# Patient Record
Sex: Male | Born: 1975 | Race: Black or African American | Hispanic: No | Marital: Single | Smoking: Current every day smoker
Health system: Southern US, Community
[De-identification: ages and names within clinical notes are randomized; demographics above are authoritative.]

## PROBLEM LIST (undated history)

## (undated) DIAGNOSIS — J3489 Other specified disorders of nose and nasal sinuses: Secondary | ICD-10-CM

## (undated) DIAGNOSIS — F319 Bipolar disorder, unspecified: Secondary | ICD-10-CM

## (undated) DIAGNOSIS — Z87442 Personal history of urinary calculi: Secondary | ICD-10-CM

## (undated) HISTORY — PX: CIRCUMCISION: SUR203

---

## 2012-09-29 ENCOUNTER — Emergency Department (HOSPITAL_COMMUNITY)
Admission: EM | Admit: 2012-09-29 | Discharge: 2012-09-29 | Disposition: A | Discharge status: Home / Self Care | Payer: Medicare Other | Attending: Emergency Medicine | Admitting: Emergency Medicine

## 2012-09-29 ENCOUNTER — Encounter (HOSPITAL_COMMUNITY): Payer: Self-pay | Admitting: *Deleted

## 2012-09-29 ENCOUNTER — Emergency Department (HOSPITAL_COMMUNITY): Payer: Medicare Other

## 2012-09-29 DIAGNOSIS — Y92410 Unspecified street and highway as the place of occurrence of the external cause: Secondary | ICD-10-CM | POA: Insufficient documentation

## 2012-09-29 DIAGNOSIS — Y9389 Activity, other specified: Secondary | ICD-10-CM | POA: Insufficient documentation

## 2012-09-29 DIAGNOSIS — S46909A Unspecified injury of unspecified muscle, fascia and tendon at shoulder and upper arm level, unspecified arm, initial encounter: Secondary | ICD-10-CM | POA: Insufficient documentation

## 2012-09-29 DIAGNOSIS — F17200 Nicotine dependence, unspecified, uncomplicated: Secondary | ICD-10-CM | POA: Insufficient documentation

## 2012-09-29 DIAGNOSIS — S4980XA Other specified injuries of shoulder and upper arm, unspecified arm, initial encounter: Secondary | ICD-10-CM | POA: Insufficient documentation

## 2012-09-29 DIAGNOSIS — M25512 Pain in left shoulder: Secondary | ICD-10-CM

## 2012-09-29 DIAGNOSIS — Z8659 Personal history of other mental and behavioral disorders: Secondary | ICD-10-CM | POA: Insufficient documentation

## 2012-09-29 HISTORY — DX: Bipolar disorder, unspecified: F31.9

## 2012-09-29 MED ORDER — IBUPROFEN 400 MG PO TABS: 400.0000 mg | ORAL_TABLET | Freq: Four times a day (QID) | ORAL | Status: DC | PRN
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
## 2012-09-29 NOTE — ED Notes (Signed)
Called no answer

## 2012-09-29 NOTE — ED Notes (Signed)
No answer
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
## 2012-10-03 NOTE — ED Provider Notes (Signed)
Medical screening examination/treatment/procedure(s) were performed by non-physician practitioner and as supervising physician I was immediately available for consultation/collaboration.  Kirstina Leinweber, MD 10/03/12 1145 

## 2013-11-21 ENCOUNTER — Emergency Department (HOSPITAL_COMMUNITY): Payer: Medicare Other

## 2013-11-21 ENCOUNTER — Emergency Department (HOSPITAL_COMMUNITY)
Admission: EM | Admit: 2013-11-21 | Discharge: 2013-11-21 | Disposition: A | Discharge status: Home / Self Care | Payer: Medicare Other | Attending: Emergency Medicine | Admitting: Emergency Medicine

## 2013-11-21 ENCOUNTER — Encounter (HOSPITAL_COMMUNITY): Payer: Self-pay | Admitting: Emergency Medicine

## 2013-11-21 DIAGNOSIS — N200 Calculus of kidney: Secondary | ICD-10-CM | POA: Diagnosis not present

## 2013-11-21 DIAGNOSIS — R109 Unspecified abdominal pain: Secondary | ICD-10-CM | POA: Insufficient documentation

## 2013-11-21 DIAGNOSIS — Z8659 Personal history of other mental and behavioral disorders: Secondary | ICD-10-CM | POA: Insufficient documentation

## 2013-11-21 DIAGNOSIS — Z720 Tobacco use: Secondary | ICD-10-CM | POA: Insufficient documentation

## 2013-11-21 LAB — URINALYSIS, ROUTINE W REFLEX MICROSCOPIC
BILIRUBIN URINE: NEGATIVE
Glucose, UA: NEGATIVE mg/dL
KETONES UR: NEGATIVE mg/dL
NITRITE: NEGATIVE
PH: 5.5 (ref 5.0–8.0)
Protein, ur: 30 mg/dL — AB
Specific Gravity, Urine: 1.024 (ref 1.005–1.030)
Urobilinogen, UA: 1 mg/dL (ref 0.0–1.0)

## 2013-11-21 LAB — POTASSIUM: POTASSIUM: 4.6 meq/L (ref 3.7–5.3)

## 2013-11-21 LAB — URINE MICROSCOPIC-ADD ON

## 2013-11-21 LAB — COMPREHENSIVE METABOLIC PANEL
ALK PHOS: 54 U/L (ref 39–117)
ALT: 23 U/L (ref 0–53)
AST: 35 U/L (ref 0–37)
Albumin: 4.1 g/dL (ref 3.5–5.2)
Anion gap: 11 (ref 5–15)
BILIRUBIN TOTAL: 0.7 mg/dL (ref 0.3–1.2)
BUN: 13 mg/dL (ref 6–23)
CHLORIDE: 102 meq/L (ref 96–112)
CO2: 25 meq/L (ref 19–32)
CREATININE: 0.98 mg/dL (ref 0.50–1.35)
Calcium: 9.3 mg/dL (ref 8.4–10.5)
GFR calc Af Amer: >90 mL/min (ref >90)
Glucose, Bld: 96 mg/dL (ref 70–99)
POTASSIUM: 6.6 meq/L — AB (ref 3.7–5.3)
Sodium: 138 mEq/L (ref 137–147)
Total Protein: 8.4 g/dL — ABNORMAL HIGH (ref 6.0–8.3)

## 2013-11-21 LAB — CBC WITH DIFFERENTIAL/PLATELET
Basophils Absolute: 0 10*3/uL (ref 0.0–0.1)
Basophils Relative: 0 % (ref 0–1)
Eosinophils Absolute: 0.1 10*3/uL (ref 0.0–0.7)
Eosinophils Relative: 1 % (ref 0–5)
HEMATOCRIT: 45 % (ref 39.0–52.0)
HEMOGLOBIN: 15.5 g/dL (ref 13.0–17.0)
LYMPHS PCT: 34 % (ref 12–46)
Lymphs Abs: 2.5 10*3/uL (ref 0.7–4.0)
MCH: 30.3 pg (ref 26.0–34.0)
MCHC: 34.4 g/dL (ref 30.0–36.0)
MCV: 88.1 fL (ref 78.0–100.0)
MONO ABS: 0.6 10*3/uL (ref 0.1–1.0)
MONOS PCT: 8 % (ref 3–12)
NEUTROS ABS: 4.2 10*3/uL (ref 1.7–7.7)
NEUTROS PCT: 57 % (ref 43–77)
Platelets: 221 10*3/uL (ref 150–400)
RBC: 5.11 MIL/uL (ref 4.22–5.81)
RDW: 12.8 % (ref 11.5–15.5)
WBC: 7.5 10*3/uL (ref 4.0–10.5)

## 2013-11-21 LAB — LIPASE, BLOOD: LIPASE: 29 U/L (ref 11–59)

## 2013-11-21 MED ORDER — TRAMADOL HCL 50 MG PO TABS
50.0000 mg | ORAL_TABLET | Freq: Once | ORAL | Status: AC
  Administered 2013-11-21: 50 mg via ORAL
  Filled 2013-11-21: qty 1

## 2013-11-21 MED ORDER — TRAMADOL HCL 50 MG PO TABS: 50.0000 mg | ORAL_TABLET | Freq: Four times a day (QID) | ORAL | Status: DC | PRN

## 2013-11-21 NOTE — ED Provider Notes (Signed)
CSN: [id number]     Arrival date & time 11/21/13  1010 History   First MD Initiated Contact with Patient 11/21/13 1024     Chief Complaint  Patient presents with  . Flank Pain  . Abdominal Pain     (Consider location/radiation/quality/duration/timing/severity/associated sxs/prior Treatment) HPI Randall [name] is a 38 y.o. male who presents to emergency department complaining of right upper abdominal pain, right flank pain. Patient states his pain has been intermittent for 2 months. He states it comes and goes, when it comes its about 8 out of 10, sharp, radiating to the right groin. He denies any associated nausea or vomiting. No changes in bowels. No urinary symptoms, no blood in his urine. He denies any fever or chills. He states pain is worse at night and when he lifts heavy furniture. Patient states he works for a [employer]. States his job requires him to lift, push, pull, heavy objects all day. He states he is taking Aleve for pain which helps. He states today pain worsened so he came to emergency department to begin her out what's going on. No history of the same.  Past Medical History  Diagnosis Date  . Bipolar 1 disorder    No past surgical history on file. No family history on file. History  Substance Use Topics  . Smoking status: Current Every Day Smoker  . Smokeless tobacco: Not on file  . Alcohol Use: Yes     Comment: occ    Review of Systems  Constitutional: Negative for fever and chills.  Respiratory: Negative for cough, chest tightness and shortness of breath.   Cardiovascular: Negative for chest pain, palpitations and leg swelling.  Gastrointestinal: Positive for abdominal pain. Negative for nausea, vomiting, diarrhea and abdominal distention.  Genitourinary: Positive for flank pain. Negative for dysuria, urgency, frequency and hematuria.  Musculoskeletal: Negative for arthralgias, myalgias, neck pain and neck stiffness.  Skin: Negative for rash.   Allergic/Immunologic: Negative for immunocompromised state.  Neurological: Negative for dizziness, weakness, light-headedness, numbness and headaches.  All other systems reviewed and are negative.     Allergies  Review of patient's allergies indicates no known allergies.  Home Medications   Prior to Admission medications   Medication Sig Start Date End Date Taking? Authorizing Provider  ibuprofen (ADVIL,MOTRIN) 400 MG tablet Take 1 tablet (400 mg total) by mouth every 6 (six) hours as needed for pain. 09/29/12   Junius FinnerErin O'Malley, Randall-C   BP 145/81  Pulse 86  Temp(Src) 98 F (36.7 C) (Oral)  Resp 19  Ht 6' 0.5" (1.842 m)  Wt 200 lb (90.719 kg)  BMI 26.74 kg/m2  SpO2 99% Physical Exam  Nursing note and vitals reviewed. Constitutional: He is oriented to person, place, and time. He appears well-developed and well-nourished. No distress.  HENT:  Head: Normocephalic and atraumatic.  Eyes: Conjunctivae are normal.  Neck: Neck supple.  Cardiovascular: Normal rate, regular rhythm and normal heart sounds.   Pulmonary/Chest: Effort normal. No respiratory distress. He has no wheezes. He has no rales.  Abdominal: Soft. Bowel sounds are normal. He exhibits no distension. There is no tenderness. There is no rebound.  No CVA tenderness bilaterally  Musculoskeletal: He exhibits no edema.  Neurological: He is alert and oriented to person, place, and time.  Skin: Skin is warm and dry.    ED Course  Procedures (including critical care time) Labs Review Labs Reviewed  COMPREHENSIVE METABOLIC PANEL - Abnormal; Notable for the following:    Potassium 6.6 (*)  Total Protein 8.4 (*)    All other components within normal limits  URINALYSIS, ROUTINE W REFLEX MICROSCOPIC - Abnormal; Notable for the following:    Hgb urine dipstick TRACE (*)    Protein, ur 30 (*)    Leukocytes, UA SMALL (*)    All other components within normal limits  CBC WITH DIFFERENTIAL  LIPASE, BLOOD  URINE  MICROSCOPIC-ADD ON  POTASSIUM    Imaging Review No results found.   EKG Interpretation None      MDM   Final diagnoses:  Flank pain, acute    Patient with intermittent right flank and right abdominal pain for 2 months. Pain comes and goes, today was worse. Currently subsided. question Cholelithiasis versus kidney stone versus musculoskeletal pain. Will get blood work, urinalysis. Patient does not want any medications at this time.  Labs normal except for potassium elevated 6.6, hemolyzed, will recheck.  CT pending. Signed out to Randall [name]       Randall [name] A Randall Peltzer, Randall-C 11/21/13 1205

## 2013-11-21 NOTE — ED Provider Notes (Signed)
12:11 PM Patient signed out to me by Jaynie Crumbleatyana Kirichenko, PA-C. Patient disposition pending CT abdomen pelvis to evaluate for possible kidney stone. Patient also noted to have hyperkalemia which will be rechecked due to hemolysis.   1:04 PM Patient's CT abdomen pelvis shows an 18mm renal stone on the right. I will consult Urology for further evaluation.   Results for orders placed during the hospital encounter of 11/21/13  CBC WITH DIFFERENTIAL      Result Value Ref Range   WBC 7.5  4.0 - 10.5 K/uL   RBC 5.11  4.22 - 5.81 MIL/uL   Hemoglobin 15.5  13.0 - 17.0 g/dL   HCT 16.145.0  09.639.0 - 04.552.0 %   MCV 88.1  78.0 - 100.0 fL   MCH 30.3  26.0 - 34.0 pg   MCHC 34.4  30.0 - 36.0 g/dL   RDW 40.912.8  81.111.5 - 91.415.5 %   Platelets 221  150 - 400 K/uL   Neutrophils Relative % 57  43 - 77 %   Neutro Abs 4.2  1.7 - 7.7 K/uL   Lymphocytes Relative 34  12 - 46 %   Lymphs Abs 2.5  0.7 - 4.0 K/uL   Monocytes Relative 8  3 - 12 %   Monocytes Absolute 0.6  0.1 - 1.0 K/uL   Eosinophils Relative 1  0 - 5 %   Eosinophils Absolute 0.1  0.0 - 0.7 K/uL   Basophils Relative 0  0 - 1 %   Basophils Absolute 0.0  0.0 - 0.1 K/uL  COMPREHENSIVE METABOLIC PANEL      Result Value Ref Range   Sodium 138  137 - 147 mEq/L   Potassium 6.6 (*) 3.7 - 5.3 mEq/L   Chloride 102  96 - 112 mEq/L   CO2 25  19 - 32 mEq/L   Glucose, Bld 96  70 - 99 mg/dL   BUN 13  6 - 23 mg/dL   Creatinine, Ser 7.820.98  0.50 - 1.35 mg/dL   Calcium 9.3  8.4 - 95.610.5 mg/dL   Total Protein 8.4 (*) 6.0 - 8.3 g/dL   Albumin 4.1  3.5 - 5.2 g/dL   AST 35  0 - 37 U/L   ALT 23  0 - 53 U/L   Alkaline Phosphatase 54  39 - 117 U/L   Total Bilirubin 0.7  0.3 - 1.2 mg/dL   GFR calc non Af Amer >90  >90 mL/min   GFR calc Af Amer >90  >90 mL/min   Anion gap 11  5 - 15  LIPASE, BLOOD      Result Value Ref Range   Lipase 29  11 - 59 U/L  URINALYSIS, ROUTINE W REFLEX MICROSCOPIC      Result Value Ref Range   Color, Urine YELLOW  YELLOW   APPearance CLEAR  CLEAR    Specific Gravity, Urine 1.024  1.005 - 1.030   pH 5.5  5.0 - 8.0   Glucose, UA NEGATIVE  NEGATIVE mg/dL   Hgb urine dipstick TRACE (*) NEGATIVE   Bilirubin Urine NEGATIVE  NEGATIVE   Ketones, ur NEGATIVE  NEGATIVE mg/dL   Protein, ur 30 (*) NEGATIVE mg/dL   Urobilinogen, UA 1.0  0.0 - 1.0 mg/dL   Nitrite NEGATIVE  NEGATIVE   Leukocytes, UA SMALL (*) NEGATIVE  URINE MICROSCOPIC-ADD ON      Result Value Ref Range   WBC, UA 7-10  <3 WBC/hpf   RBC / HPF 0-2  <3 RBC/hpf  Bacteria, UA RARE  RARE  POTASSIUM      Result Value Ref Range   Potassium 4.6  3.7 - 5.3 mEq/L   Ct Abdomen Pelvis Wo Contrast  11/21/2013   CLINICAL DATA:  Initial encounter for acute left flank pain.  EXAM: CT ABDOMEN AND PELVIS WITHOUT CONTRAST  TECHNIQUE: Multidetector CT imaging of the abdomen and pelvis was performed following the standard protocol without IV contrast.  COMPARISON:  None.  FINDINGS: Lower chest: Chronic interstitial change seen in the lung bases likely related to smoking.  Hepatobiliary: No focal abnormality in the liver on this study without intravenous contrast. No evidence for hepatomegaly. There is no evidence for gallstones, gallbladder wall thickening, or pericholecystic fluid. No intrahepatic or extrahepatic biliary dilation.  Pancreas: No focal mass lesion. No dilatation of the main duct. No intraparenchymal cyst. No peripancreatic edema.  Spleen: No splenomegaly. No focal mass lesion.  Adrenals/Urinary Tract: No adrenal nodule or mass. 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 No evidence for ureteral or bladder stones.  Stomach/Bowel: Stomach is nondistended. No gastric wall thickening. No evidence of outlet obstruction. Duodenum diverticulum evident. No small bowel wall thickening. No small bowel dilatation. Terminal ileum is normal. Appendix is normal. No gross colonic mass. No  colonic wall thickening. No substantial diverticular change.  Vascular/Lymphatic: No abdominal aortic aneurysm. There is gastrohepatic or hepatoduodenal ligament lymphadenopathy. No retroperitoneal lymphadenopathy. Pelvic sidewall lymphadenopathy.  Reproductive: No intraperitoneal free fluid.  Other: Bone windows reveal no worrisome lytic or sclerotic osseous lesions.  Musculoskeletal: Bone windows reveal no worrisome lytic or sclerotic osseous lesions.  IMPRESSION: 18 x 12 x 15 mm nonobstructing stone in the right renal pelvis. No secondary changes in the right kidney.  Otherwise unremarkable exam of the abdomen and pelvis.   Electronically Signed   By: Kennith CenterEric  Mansell M.D.   On: 11/21/2013 12:49    2:20 PM I spoke with Dr. Vernie Ammonsttelin who recommends office follow up. Patient will be discharged with Tramadol for pain. Patient instructed to call the office after being discharged to schedule a follow up visit.   Emilia BeckKaitlyn Ryshawn Sanzone, PA-C 11/21/13 1421

## 2013-11-21 NOTE — Discharge Instructions (Signed)
Follow up with Dr. Vernie Ammonsttelin for further evaluation and removal of your kidney stone. Refer to attached documents for more information. Take Tramadol as needed for pain.

## 2013-11-21 NOTE — ED Notes (Signed)
To ED via private vehicle with c/o right sided flank/rib pain intermittently for 2 months--comes on for several hours, then leaves. Denies any hematuria, dysuria. States hurts mostly at night when laying on it,

## 2013-11-28 ENCOUNTER — Other Ambulatory Visit: Payer: Self-pay | Admitting: Urology

## 2013-12-10 NOTE — Patient Instructions (Signed)
Randall [name]  12/10/2013   Your procedure is scheduled on:  12/23/2013    [facility] to [facility].  Follow the Signs to Short Stay Center at   0530     am  Call this number if you have problems the morning of surgery: [phone]   Remember:   Do not eat food or drink liquids after midnight.   Take these medicines the morning of surgery with A SIP OF WATER:    Do not wear jewelry, .  Do not wear lotions, powders, or perfumes.  deodorant.  . Men may shave face and neck.  Do not bring valuables to the hospital.  Contacts, dentures or bridgework may not be worn into surgery.  Leave suitcase in the car. After surgery it may be brought to your room.  For patients admitted to the hospital, checkout time is 11:00 AM the day of  discharge.        Please read over the following fact sheets that you were given: , coughing and deep breathing exercises, leg exercises            [place] - Preparing for Surgery Before surgery, you can play an important role.  Because skin is not sterile, your skin needs to be as free of germs as possible.  You can reduce the number of germs on your skin by washing with CHG (chlorahexidine gluconate) soap before surgery.  CHG is an antiseptic cleaner which kills germs and bonds with the skin to continue killing germs even after washing. Please DO NOT use if you have an allergy to CHG or antibacterial soaps.  If your skin becomes reddened/irritated stop using the CHG and inform your nurse when you arrive at Short Stay. Do not shave (including legs and underarms) for at least 48 hours prior to the first CHG shower.  You may shave your face/neck. Please follow these instructions carefully:  1.  Shower with CHG Soap the night before surgery and the  morning of Surgery.  2.  If you choose to wash your hair, wash your hair first as usual with your  normal  shampoo.  3.  After you shampoo, rinse your hair and body thoroughly to remove the  shampoo.                            4.  Use CHG as you would any other liquid soap.  You can apply chg directly  to the skin and wash                       Gently with a scrungie or clean washcloth.  5.  Apply the CHG Soap to your body ONLY FROM THE NECK DOWN.   Do not use on face/ open                           Wound or open sores. Avoid contact with eyes, ears mouth and genitals (private parts).                       Wash face,  Genitals (private parts) with your normal soap.             6.  Wash thoroughly, paying special attention to the area where your surgery  will be performed.  7.  Thoroughly rinse your body with warm water from the  neck down.  8.  DO NOT shower/wash with your normal soap after using and rinsing off  the CHG Soap.                9.  Pat yourself dry with a clean towel.            10.  Wear clean pajamas.            11.  Place clean sheets on your bed the night of your first shower and do not  sleep with pets. Day of Surgery : Do not apply any lotions/deodorants the morning of surgery.  Please wear clean clothes to the hospital/surgery center.  FAILURE TO FOLLOW THESE INSTRUCTIONS MAY RESULT IN THE CANCELLATION OF YOUR SURGERY PATIENT SIGNATURE_________________________________  NURSE SIGNATURE__________________________________  ________________________________________________________________________  WHAT IS A BLOOD TRANSFUSION? Blood Transfusion Information  A transfusion is the replacement of blood or some of its parts. Blood is made up of multiple cells which provide different functions.  Red blood cells carry oxygen and are used for blood loss replacement.  White blood cells fight against infection.  Platelets control bleeding.  Plasma helps clot blood.  Other blood products are available for specialized needs, such as hemophilia or other clotting disorders. BEFORE THE TRANSFUSION  Who gives blood for transfusions?   Healthy volunteers who are fully evaluated to make sure  their blood is safe. This is blood bank blood. Transfusion therapy is the safest it has ever been in the practice of medicine. Before blood is taken from a donor, a complete history is taken to make sure that person has no history of diseases nor engages in risky social behavior (examples are intravenous drug use or sexual activity with multiple partners). The donor's travel history is screened to minimize risk of transmitting infections, such as malaria. The donated blood is tested for signs of infectious diseases, such as HIV and hepatitis. The blood is then tested to be sure it is compatible with you in order to minimize the chance of a transfusion reaction. If you or a relative donates blood, this is often done in anticipation of surgery and is not appropriate for emergency situations. It takes many days to process the donated blood. RISKS AND COMPLICATIONS Although transfusion therapy is very safe and saves many lives, the main dangers of transfusion include:   Getting an infectious disease.  Developing a transfusion reaction. This is an allergic reaction to something in the blood you were given. Every precaution is taken to prevent this. The decision to have a blood transfusion has been considered carefully by your caregiver before blood is given. Blood is not given unless the benefits outweigh the risks. AFTER THE TRANSFUSION  Right after receiving a blood transfusion, you will usually feel much better and more energetic. This is especially true if your red blood cells have gotten low (anemic). The transfusion raises the level of the red blood cells which carry oxygen, and this usually causes an energy increase.  The nurse administering the transfusion will monitor you carefully for complications. HOME CARE INSTRUCTIONS  No special instructions are needed after a transfusion. You may find your energy is better. Speak with your caregiver about any limitations on activity for underlying diseases  you may have. SEEK MEDICAL CARE IF:   Your condition is not improving after your transfusion.  You develop redness or irritation at the intravenous (IV) site. SEEK IMMEDIATE MEDICAL CARE IF:  Any of the following symptoms occur over the next 12 hours:  Shaking chills.  You have a temperature by mouth above 102 F (38.9 C), not controlled by medicine.  Chest, back, or muscle pain.  People around you feel you are not acting correctly or are confused.  Shortness of breath or difficulty breathing.  Dizziness and fainting.  You get a rash or develop hives.  You have a decrease in urine output.  Your urine turns a dark color or changes to pink, red, or brown. Any of the following symptoms occur over the next 10 days:  You have a temperature by mouth above 102 F (38.9 C), not controlled by medicine.  Shortness of breath.  Weakness after normal activity.  The white part of the eye turns yellow (jaundice).  You have a decrease in the amount of urine or are urinating less often.  Your urine turns a dark color or changes to pink, red, or brown. Document Released: 01/07/2000 Document Revised: 04/03/2011 Document Reviewed: 08/26/2007 [facility] Patient Information 2014 [place], [place].  _______________________________________________________________________

## 2013-12-12 ENCOUNTER — Inpatient Hospital Stay (HOSPITAL_COMMUNITY)
Admission: RE | Admit: 2013-12-12 | Discharge: 2013-12-12 | Disposition: A | Discharge status: Home / Self Care | Payer: Medicare Other | Source: Ambulatory Visit

## 2013-12-17 ENCOUNTER — Inpatient Hospital Stay (HOSPITAL_COMMUNITY): Admission: RE | Admit: 2013-12-17 | Payer: Medicare Other | Source: Ambulatory Visit

## 2013-12-22 ENCOUNTER — Encounter (HOSPITAL_COMMUNITY)
Admission: RE | Admit: 2013-12-22 | Discharge: 2013-12-22 | Disposition: A | Discharge status: Home / Self Care | Payer: Medicare Other | Source: Ambulatory Visit | Attending: Urology | Admitting: Urology

## 2013-12-22 ENCOUNTER — Encounter (HOSPITAL_COMMUNITY): Payer: Self-pay

## 2013-12-22 ENCOUNTER — Encounter (HOSPITAL_COMMUNITY): Payer: Self-pay | Admitting: Anesthesiology

## 2013-12-22 DIAGNOSIS — Z801 Family history of malignant neoplasm of trachea, bronchus and lung: Secondary | ICD-10-CM | POA: Diagnosis not present

## 2013-12-22 DIAGNOSIS — F1099 Alcohol use, unspecified with unspecified alcohol-induced disorder: Secondary | ICD-10-CM | POA: Diagnosis not present

## 2013-12-22 DIAGNOSIS — Z8249 Family history of ischemic heart disease and other diseases of the circulatory system: Secondary | ICD-10-CM | POA: Diagnosis not present

## 2013-12-22 DIAGNOSIS — F17200 Nicotine dependence, unspecified, uncomplicated: Secondary | ICD-10-CM | POA: Diagnosis not present

## 2013-12-22 DIAGNOSIS — N200 Calculus of kidney: Secondary | ICD-10-CM | POA: Diagnosis not present

## 2013-12-22 HISTORY — DX: Other specified disorders of nose and nasal sinuses: J34.89

## 2013-12-22 HISTORY — DX: Personal history of urinary calculi: Z87.442

## 2013-12-22 LAB — CBC
HCT: 44.6 % (ref 39.0–52.0)
HEMOGLOBIN: 15 g/dL (ref 13.0–17.0)
MCH: 29.8 pg (ref 26.0–34.0)
MCHC: 33.6 g/dL (ref 30.0–36.0)
MCV: 88.7 fL (ref 78.0–100.0)
PLATELETS: 240 10*3/uL (ref 150–400)
RBC: 5.03 MIL/uL (ref 4.22–5.81)
RDW: 12.8 % (ref 11.5–15.5)
WBC: 8.6 10*3/uL (ref 4.0–10.5)

## 2013-12-22 LAB — BASIC METABOLIC PANEL
ANION GAP: 11 (ref 5–15)
BUN: 10 mg/dL (ref 6–23)
CHLORIDE: 98 meq/L (ref 96–112)
CO2: 27 meq/L (ref 19–32)
Calcium: 9.7 mg/dL (ref 8.4–10.5)
Creatinine, Ser: 1 mg/dL (ref 0.50–1.35)
GFR calc Af Amer: >90 mL/min (ref >90)
GFR calc non Af Amer: >90 mL/min (ref >90)
GLUCOSE: 95 mg/dL (ref 70–99)
POTASSIUM: 4.1 meq/L (ref 3.7–5.3)
SODIUM: 136 meq/L — AB (ref 137–147)

## 2013-12-22 LAB — ABO/RH: ABO/RH(D): O POS

## 2013-12-22 NOTE — Anesthesia Preprocedure Evaluation (Addendum)
Anesthesia Evaluation  Patient identified by MRN, date of birth, ID band Patient awake    Reviewed: Allergy & Precautions, H&P , NPO status , Patient's Chart, lab work & pertinent test results  Airway Mallampati: II  TM Distance: >3 FB Neck ROM: Full    Dental  (+) Poor Dentition, Dental Advisory Given Very poor dentition with many broken and missing teeth.:   Pulmonary Current Smoker,  breath sounds clear to auscultation  Pulmonary exam normal       Cardiovascular negative cardio ROS  Rhythm:Regular Rate:Normal     Neuro/Psych PSYCHIATRIC DISORDERS Bipolar Disorder negative neurological ROS     GI/Hepatic negative GI ROS, Neg liver ROS,   Endo/Other  negative endocrine ROS  Renal/GU negative Renal ROS  negative genitourinary   Musculoskeletal negative musculoskeletal ROS (+)   Abdominal   Peds negative pediatric ROS (+)  Hematology negative hematology ROS (+)   Anesthesia Other Findings   Reproductive/Obstetrics negative OB ROS                            Anesthesia Physical Anesthesia Plan  ASA: II  Anesthesia Plan: General   Post-op Pain Management:    Induction: Intravenous  Airway Management Planned: Oral ETT  Additional Equipment:   Intra-op Plan:   Post-operative Plan: Extubation in OR  Informed Consent: I have reviewed the patients History and Physical, chart, labs and discussed the procedure including the risks, benefits and alternatives for the proposed anesthesia with the patient or authorized representative who has indicated his/her understanding and acceptance.   Dental advisory given  Plan Discussed with: CRNA  Anesthesia Plan Comments:         Anesthesia Quick Evaluation

## 2013-12-22 NOTE — Patient Instructions (Addendum)
[address] Wonda HornerBruce Griebel  12/22/2013   Your procedure is scheduled on: Tuesday 12/23/13  [facility] at 05:30 AM.  Call this number if you have problems the morning of surgery [phone]: [phone]   Remember:   Do not eat food or drink liquids After Midnight.   Do not wear jewelry, make-up or nail polish.  Do not wear lotions, powders, or perfumes.   Do not shave 48 hours prior to surgery. Men may shave face and neck.  Do not bring valuables to the hospital.  Contacts, dentures or bridgework may not be worn into surgery.  Leave suitcase in the car. After surgery it may be brought to your room.  For patients admitted to the hospital, checkout time is 11:00 AM the day of discharge.    [place] - Preparing for Surgery Before surgery, you can play an important role.  Because skin is not sterile, your skin needs to be as free of germs as possible.  You can reduce the number of germs on your skin by washing with CHG (chlorahexidine gluconate) soap before surgery.  CHG is an antiseptic cleaner which kills germs and bonds with the skin to continue killing germs even after washing. Please DO NOT use if you have an allergy to CHG or antibacterial soaps.  If your skin becomes reddened/irritated stop using the CHG and inform your nurse when you arrive at Short Stay. Do not shave (including legs and underarms) for at least 48 hours prior to the first CHG shower.  You may shave your face/neck. Please follow these instructions carefully:  1.  Shower with CHG Soap the night before surgery and the  morning of Surgery.  2.  If you choose to wash your hair, wash your hair first as usual with your  normal  shampoo.  3.  After you shampoo, rinse your hair and body thoroughly to remove the  shampoo.                            4.  Use CHG as you would any other liquid soap.  You can apply chg directly  to the skin and wash                       Gently with a scrungie or clean washcloth.  5.   Apply the CHG Soap to your body ONLY FROM THE NECK DOWN.   Do not use on face/ open                           Wound or open sores. Avoid contact with eyes, ears mouth and genitals (private parts).                       Wash face,  Genitals (private parts) with your normal soap.             6.  Wash thoroughly, paying special attention to the area where your surgery  will be performed.  7.  Thoroughly rinse your body with warm water from the neck down.  8.  DO NOT shower/wash with your normal soap after using and rinsing off  the CHG Soap.                9.  Pat yourself dry with a clean towel.  10.  Wear clean pajamas.            11.  Place clean sheets on your bed the night of your first shower and do not  sleep with pets. Day of Surgery : Do not apply any lotions/deodorants the morning of surgery.  Please wear clean clothes to the hospital/surgery center.  FAILURE TO FOLLOW THESE INSTRUCTIONS MAY RESULT IN THE CANCELLATION OF YOUR SURGERY PATIENT SIGNATURE_________________________________  NURSE SIGNATURE__________________________________  ________________________________________________________________________  WHAT IS A BLOOD TRANSFUSION? Blood Transfusion Information  A transfusion is the replacement of blood or some of its parts. Blood is made up of multiple cells which provide different functions.  Red blood cells carry oxygen and are used for blood loss replacement.  White blood cells fight against infection.  Platelets control bleeding.  Plasma helps clot blood.  Other blood products are available for specialized needs, such as hemophilia or other clotting disorders. BEFORE THE TRANSFUSION  Who gives blood for transfusions?   Healthy volunteers who are fully evaluated to make sure their blood is safe. This is blood bank blood. Transfusion therapy is the safest it has ever been in the practice of medicine. Before blood is taken from a donor, a complete history is  taken to make sure that person has no history of diseases nor engages in risky social behavior (examples are intravenous drug use or sexual activity with multiple partners). The donor's travel history is screened to minimize risk of transmitting infections, such as malaria. The donated blood is tested for signs of infectious diseases, such as HIV and hepatitis. The blood is then tested to be sure it is compatible with you in order to minimize the chance of a transfusion reaction. If you or a relative donates blood, this is often done in anticipation of surgery and is not appropriate for emergency situations. It takes many days to process the donated blood. RISKS AND COMPLICATIONS Although transfusion therapy is very safe and saves many lives, the main dangers of transfusion include:   Getting an infectious disease.  Developing a transfusion reaction. This is an allergic reaction to something in the blood you were given. Every precaution is taken to prevent this. The decision to have a blood transfusion has been considered carefully by your caregiver before blood is given. Blood is not given unless the benefits outweigh the risks. AFTER THE TRANSFUSION  Right after receiving a blood transfusion, you will usually feel much better and more energetic. This is especially true if your red blood cells have gotten low (anemic). The transfusion raises the level of the red blood cells which carry oxygen, and this usually causes an energy increase.  The nurse administering the transfusion will monitor you carefully for complications. HOME CARE INSTRUCTIONS  No special instructions are needed after a transfusion. You may find your energy is better. Speak with your caregiver about any limitations on activity for underlying diseases you may have. SEEK MEDICAL CARE IF:   Your condition is not improving after your transfusion.  You develop redness or irritation at the intravenous (IV) site. SEEK IMMEDIATE  MEDICAL CARE IF:  Any of the following symptoms occur over the next 12 hours:  Shaking chills.  You have a temperature by mouth above 102 F (38.9 C), not controlled by medicine.  Chest, back, or muscle pain.  People around you feel you are not acting correctly or are confused.  Shortness of breath or difficulty breathing.  Dizziness and fainting.  You get a rash or develop  hives.  You have a decrease in urine output.  Your urine turns a dark color or changes to pink, red, or brown. Any of the following symptoms occur over the next 10 days:  You have a temperature by mouth above 102 F (38.9 C), not controlled by medicine.  Shortness of breath.  Weakness after normal activity.  The white part of the eye turns yellow (jaundice).  You have a decrease in the amount of urine or are urinating less often.  Your urine turns a dark color or changes to pink, red, or brown. Document Released: 01/07/2000 Document Revised: 04/03/2011 Document Reviewed: 08/26/2007 ExitCare Patient Information 2014 [employer], [place].  _______________________________________________________________________   CLEAR LIQUID DIET   Foods Allowed                                                                     Foods Excluded  Coffee and tea, regular and decaf                             liquids that you cannot  Plain Jell-O in any flavor                                             see through such as: Fruit ices (not with fruit pulp)                                     milk, soups, orange juice  Iced Popsicles                                    All solid food Carbonated beverages, regular and diet                                    Cranberry, grape and apple juices Sports drinks like Gatorade Lightly seasoned clear broth or consume(fat free) Sugar, honey syrup  Sample Menu Breakfast                                Lunch                                     Supper Cranberry juice                     Beef broth                            Chicken broth Jell-O                                     Grape juice  Apple juice Coffee or tea                        Jell-O                                      Popsicle                                                Coffee or tea                        Coffee or tea  _____________________________________________________________________

## 2013-12-23 ENCOUNTER — Encounter (HOSPITAL_COMMUNITY): Payer: Self-pay | Admitting: *Deleted

## 2013-12-23 ENCOUNTER — Encounter (HOSPITAL_COMMUNITY)
Admission: RE | Disposition: A | Discharge status: Home / Self Care | Payer: Self-pay | Source: Ambulatory Visit | Attending: Urology

## 2013-12-23 ENCOUNTER — Inpatient Hospital Stay (HOSPITAL_COMMUNITY): Payer: Medicare Other

## 2013-12-23 ENCOUNTER — Inpatient Hospital Stay (HOSPITAL_COMMUNITY): Payer: Medicare Other | Admitting: Anesthesiology

## 2013-12-23 ENCOUNTER — Observation Stay (HOSPITAL_COMMUNITY)
Admission: RE | Admit: 2013-12-23 | Discharge: 2013-12-24 | Disposition: A | Discharge status: Home / Self Care | Payer: Medicare Other | Source: Ambulatory Visit | Attending: Urology | Admitting: Urology

## 2013-12-23 DIAGNOSIS — N200 Calculus of kidney: Principal | ICD-10-CM | POA: Insufficient documentation

## 2013-12-23 DIAGNOSIS — F1099 Alcohol use, unspecified with unspecified alcohol-induced disorder: Secondary | ICD-10-CM | POA: Diagnosis not present

## 2013-12-23 DIAGNOSIS — F17200 Nicotine dependence, unspecified, uncomplicated: Secondary | ICD-10-CM | POA: Insufficient documentation

## 2013-12-23 DIAGNOSIS — Z8249 Family history of ischemic heart disease and other diseases of the circulatory system: Secondary | ICD-10-CM | POA: Diagnosis not present

## 2013-12-23 DIAGNOSIS — Z801 Family history of malignant neoplasm of trachea, bronchus and lung: Secondary | ICD-10-CM | POA: Insufficient documentation

## 2013-12-23 HISTORY — PX: NEPHROLITHOTOMY: SHX5134

## 2013-12-23 LAB — TYPE AND SCREEN
ABO/RH(D): O POS
Antibody Screen: NEGATIVE

## 2013-12-23 LAB — CBC
HCT: 39.2 % (ref 39.0–52.0)
HEMOGLOBIN: 12.9 g/dL — AB (ref 13.0–17.0)
MCH: 29.3 pg (ref 26.0–34.0)
MCHC: 32.9 g/dL (ref 30.0–36.0)
MCV: 88.9 fL (ref 78.0–100.0)
PLATELETS: 207 10*3/uL (ref 150–400)
RBC: 4.41 MIL/uL (ref 4.22–5.81)
RDW: 12.8 % (ref 11.5–15.5)
WBC: 7.9 10*3/uL (ref 4.0–10.5)

## 2013-12-23 LAB — BASIC METABOLIC PANEL
ANION GAP: 10 (ref 5–15)
BUN: 10 mg/dL (ref 6–23)
CHLORIDE: 103 meq/L (ref 96–112)
CO2: 25 mEq/L (ref 19–32)
Calcium: 8.7 mg/dL (ref 8.4–10.5)
Creatinine, Ser: 1.36 mg/dL — ABNORMAL HIGH (ref 0.50–1.35)
GFR calc Af Amer: 76 mL/min — ABNORMAL LOW (ref >90)
GFR calc non Af Amer: 66 mL/min — ABNORMAL LOW (ref >90)
Glucose, Bld: 127 mg/dL — ABNORMAL HIGH (ref 70–99)
POTASSIUM: 4 meq/L (ref 3.7–5.3)
Sodium: 138 mEq/L (ref 137–147)

## 2013-12-23 LAB — URINE CULTURE
Colony Count: NO GROWTH
Culture: NO GROWTH

## 2013-12-23 SURGERY — NEPHROLITHOTOMY PERCUTANEOUS
Anesthesia: General | Laterality: Right

## 2013-12-23 MED ORDER — HYDROMORPHONE HCL 1 MG/ML IJ SOLN
INTRAMUSCULAR | Status: AC
  Administered 2013-12-23: 0.5 mg via INTRAVENOUS
  Filled 2013-12-23: qty 1

## 2013-12-23 MED ORDER — PROMETHAZINE HCL 25 MG/ML IJ SOLN: 6.2500 mg | INTRAMUSCULAR | Status: DC | PRN

## 2013-12-23 MED ORDER — ONDANSETRON HCL 4 MG/2ML IJ SOLN
INTRAMUSCULAR | Status: AC
  Filled 2013-12-23: qty 2

## 2013-12-23 MED ORDER — HYDROMORPHONE HCL 1 MG/ML IJ SOLN: 0.5000 mg | INTRAMUSCULAR | Status: DC | PRN

## 2013-12-23 MED ORDER — BUPIVACAINE-EPINEPHRINE 0.5% -1:200000 IJ SOLN
INTRAMUSCULAR | Status: DC | PRN
  Administered 2013-12-23: 30 mL

## 2013-12-23 MED ORDER — ONDANSETRON HCL 4 MG/2ML IJ SOLN: 4.0000 mg | Freq: Four times a day (QID) | INTRAMUSCULAR | Status: DC | PRN

## 2013-12-23 MED ORDER — DOCUSATE SODIUM 100 MG PO CAPS
100.0000 mg | ORAL_CAPSULE | Freq: Two times a day (BID) | ORAL | Status: DC
  Administered 2013-12-23 – 2013-12-24 (×3): 100 mg via ORAL
  Filled 2013-12-23 (×3): qty 1

## 2013-12-23 MED ORDER — CISATRACURIUM BESYLATE (PF) 10 MG/5ML IV SOLN
INTRAVENOUS | Status: DC | PRN
  Administered 2013-12-23: 10 mg via INTRAVENOUS
  Administered 2013-12-23: 4 mg via INTRAVENOUS
  Administered 2013-12-23: 3 mg via INTRAVENOUS

## 2013-12-23 MED ORDER — CEFAZOLIN SODIUM-DEXTROSE 2-3 GM-% IV SOLR
2.0000 g | INTRAVENOUS | Status: AC
  Administered 2013-12-23: 2 g via INTRAVENOUS

## 2013-12-23 MED ORDER — FENTANYL CITRATE 0.05 MG/ML IJ SOLN
INTRAMUSCULAR | Status: AC
  Filled 2013-12-23: qty 2

## 2013-12-23 MED ORDER — IOHEXOL 300 MG/ML  SOLN
INTRAMUSCULAR | Status: DC | PRN
  Administered 2013-12-23: 100 mL

## 2013-12-23 MED ORDER — GLYCOPYRROLATE 0.2 MG/ML IJ SOLN
INTRAMUSCULAR | Status: AC
  Filled 2013-12-23: qty 4

## 2013-12-23 MED ORDER — CIPROFLOXACIN HCL 500 MG PO TABS
500.0000 mg | ORAL_TABLET | Freq: Two times a day (BID) | ORAL | Status: AC
  Administered 2013-12-23 – 2013-12-24 (×2): 500 mg via ORAL
  Filled 2013-12-23 (×2): qty 1

## 2013-12-23 MED ORDER — PROPOFOL 10 MG/ML IV BOLUS
INTRAVENOUS | Status: DC | PRN
  Administered 2013-12-23: 200 mg via INTRAVENOUS

## 2013-12-23 MED ORDER — FENTANYL CITRATE 0.05 MG/ML IJ SOLN
INTRAMUSCULAR | Status: AC
  Filled 2013-12-23: qty 5

## 2013-12-23 MED ORDER — LACTATED RINGERS IV SOLN
INTRAVENOUS | Status: DC | PRN
  Administered 2013-12-23 (×3): via INTRAVENOUS

## 2013-12-23 MED ORDER — BISACODYL 10 MG RE SUPP: 10.0000 mg | Freq: Every day | RECTAL | Status: DC | PRN

## 2013-12-23 MED ORDER — CEFAZOLIN SODIUM-DEXTROSE 2-3 GM-% IV SOLR
INTRAVENOUS | Status: AC
  Filled 2013-12-23: qty 50

## 2013-12-23 MED ORDER — MIDAZOLAM HCL 2 MG/2ML IJ SOLN
INTRAMUSCULAR | Status: AC
  Filled 2013-12-23: qty 2

## 2013-12-23 MED ORDER — CIPROFLOXACIN IN D5W 400 MG/200ML IV SOLN
INTRAVENOUS | Status: AC
  Filled 2013-12-23: qty 200

## 2013-12-23 MED ORDER — PHENYLEPHRINE HCL 10 MG/ML IJ SOLN
INTRAMUSCULAR | Status: AC
  Filled 2013-12-23: qty 1

## 2013-12-23 MED ORDER — PROPOFOL 10 MG/ML IV BOLUS
INTRAVENOUS | Status: AC
  Filled 2013-12-23: qty 20

## 2013-12-23 MED ORDER — LACTATED RINGERS IV SOLN
INTRAVENOUS | Status: DC
  Administered 2013-12-23: 1000 mL via INTRAVENOUS
  Administered 2013-12-23: 18:00:00 via INTRAVENOUS

## 2013-12-23 MED ORDER — PHENYLEPHRINE HCL 10 MG/ML IJ SOLN
INTRAMUSCULAR | Status: DC | PRN
  Administered 2013-12-23: 160 ug via INTRAVENOUS
  Administered 2013-12-23 (×2): 120 ug via INTRAVENOUS

## 2013-12-23 MED ORDER — ONDANSETRON HCL 4 MG/2ML IJ SOLN
INTRAMUSCULAR | Status: DC | PRN
  Administered 2013-12-23: 4 mg via INTRAVENOUS

## 2013-12-23 MED ORDER — DEXAMETHASONE SODIUM PHOSPHATE 10 MG/ML IJ SOLN
INTRAMUSCULAR | Status: AC
  Filled 2013-12-23: qty 1

## 2013-12-23 MED ORDER — PHENYLEPHRINE HCL 10 MG/ML IJ SOLN
10.0000 mg | INTRAVENOUS | Status: DC | PRN
  Administered 2013-12-23: 25 ug/min via INTRAVENOUS

## 2013-12-23 MED ORDER — HYDROMORPHONE HCL 1 MG/ML IJ SOLN
0.2500 mg | INTRAMUSCULAR | Status: DC | PRN
  Administered 2013-12-23 (×2): 0.5 mg via INTRAVENOUS

## 2013-12-23 MED ORDER — OXYCODONE HCL 5 MG PO TABS: 5.0000 mg | ORAL_TABLET | ORAL | Status: DC | PRN

## 2013-12-23 MED ORDER — CIPROFLOXACIN IN D5W 400 MG/200ML IV SOLN
400.0000 mg | INTRAVENOUS | Status: AC
  Administered 2013-12-23: 400 mg via INTRAVENOUS

## 2013-12-23 MED ORDER — CISATRACURIUM BESYLATE 20 MG/10ML IV SOLN
INTRAVENOUS | Status: AC
  Filled 2013-12-23: qty 20

## 2013-12-23 MED ORDER — FENTANYL CITRATE 0.05 MG/ML IJ SOLN
INTRAMUSCULAR | Status: DC | PRN
  Administered 2013-12-23: 50 ug via INTRAVENOUS
  Administered 2013-12-23: 25 ug via INTRAVENOUS
  Administered 2013-12-23: 100 ug via INTRAVENOUS
  Administered 2013-12-23: 75 ug via INTRAVENOUS

## 2013-12-23 MED ORDER — METOCLOPRAMIDE HCL 5 MG/ML IJ SOLN
INTRAMUSCULAR | Status: AC
  Filled 2013-12-23: qty 2

## 2013-12-23 MED ORDER — SODIUM CHLORIDE 0.9 % IR SOLN
Status: DC | PRN
  Administered 2013-12-23: 13000 mL

## 2013-12-23 MED ORDER — ZOLPIDEM TARTRATE 5 MG PO TABS: 5.0000 mg | ORAL_TABLET | Freq: Every evening | ORAL | Status: DC | PRN

## 2013-12-23 MED ORDER — BUPIVACAINE-EPINEPHRINE (PF) 0.5% -1:200000 IJ SOLN
INTRAMUSCULAR | Status: AC
  Filled 2013-12-23: qty 30

## 2013-12-23 MED ORDER — PHENYLEPHRINE 40 MCG/ML (10ML) SYRINGE FOR IV PUSH (FOR BLOOD PRESSURE SUPPORT)
PREFILLED_SYRINGE | INTRAVENOUS | Status: AC
  Filled 2013-12-23: qty 10

## 2013-12-23 MED ORDER — MIDAZOLAM HCL 5 MG/5ML IJ SOLN
INTRAMUSCULAR | Status: DC | PRN
  Administered 2013-12-23: 2 mg via INTRAVENOUS

## 2013-12-23 MED ORDER — ACETAMINOPHEN 10 MG/ML IV SOLN
1000.0000 mg | Freq: Four times a day (QID) | INTRAVENOUS | Status: AC
  Administered 2013-12-23 – 2013-12-24 (×4): 1000 mg via INTRAVENOUS
  Filled 2013-12-23 (×7): qty 100

## 2013-12-23 MED ORDER — NEOSTIGMINE METHYLSULFATE 10 MG/10ML IV SOLN
INTRAVENOUS | Status: DC | PRN
  Administered 2013-12-23: 5 mg via INTRAVENOUS

## 2013-12-23 MED ORDER — GLYCOPYRROLATE 0.2 MG/ML IJ SOLN
INTRAMUSCULAR | Status: DC | PRN
  Administered 2013-12-23: .8 mg via INTRAVENOUS

## 2013-12-23 MED ORDER — BELLADONNA ALKALOIDS-OPIUM 16.2-60 MG RE SUPP: 1.0000 | Freq: Three times a day (TID) | RECTAL | Status: DC | PRN

## 2013-12-23 SURGICAL SUPPLY — 53 items
BAG URINE DRAINAGE (UROLOGICAL SUPPLIES) ×4 IMPLANT
BASKET ZERO TIP NITINOL 2.4FR (BASKET) ×4 IMPLANT
BENZOIN TINCTURE PRP APPL 2/3 (GAUZE/BANDAGES/DRESSINGS) ×4 IMPLANT
BLADE SURG 15 STRL LF DISP TIS (BLADE) ×2 IMPLANT
BLADE SURG 15 STRL SS (BLADE) ×2
CATH AINSWORTH 30CC 24FR (CATHETERS) ×4 IMPLANT
CATH BEACON 5.038 65CM KMP-01 (CATHETERS) ×8 IMPLANT
CATH FOLEY 2W COUNCIL 20FR 5CC (CATHETERS) IMPLANT
CATH FOLEY 2WAY SLVR  5CC 16FR (CATHETERS) ×2
CATH FOLEY 2WAY SLVR 5CC 16FR (CATHETERS) ×2 IMPLANT
CATH INTERMIT  6FR 70CM (CATHETERS) ×4 IMPLANT
CATH ROBINSON RED A/P 20FR (CATHETERS) IMPLANT
CATH URET 5FR 28IN OPEN ENDED (CATHETERS) ×4 IMPLANT
CATH X-FORCE N30 NEPHROSTOMY (TUBING) ×4 IMPLANT
CHLORAPREP W/TINT 26ML (MISCELLANEOUS) ×8 IMPLANT
COVER SURGICAL LIGHT HANDLE (MISCELLANEOUS) ×4 IMPLANT
DRAPE C-ARM 42X120 X-RAY (DRAPES) ×4 IMPLANT
DRAPE CAMERA CLOSED 9X96 (DRAPES) ×8 IMPLANT
DRAPE LINGEMAN PERC (DRAPES) ×4 IMPLANT
DRAPE SHEET LG 3/4 BI-LAMINATE (DRAPES) ×4 IMPLANT
DRAPE SURG IRRIG POUCH 19X23 (DRAPES) ×4 IMPLANT
DRSG PAD ABDOMINAL 8X10 ST (GAUZE/BANDAGES/DRESSINGS) ×8 IMPLANT
DRSG TEGADERM 8X12 (GAUZE/BANDAGES/DRESSINGS) ×4 IMPLANT
FIBER LASER FLEXIVA 550 (UROLOGICAL SUPPLIES) IMPLANT
GAUZE SPONGE 4X4 12PLY STRL (GAUZE/BANDAGES/DRESSINGS) ×4 IMPLANT
GLOVE BIOGEL M STRL SZ7.5 (GLOVE) ×12 IMPLANT
GOWN STRL REUS W/TWL XL LVL3 (GOWN DISPOSABLE) ×12 IMPLANT
GUIDEWIRE AMPLAZ .035X145 (WIRE) ×8 IMPLANT
GUIDEWIRE ANG ZIPWIRE 038X150 (WIRE) ×8 IMPLANT
GUIDEWIRE STR DUAL SENSOR (WIRE) ×4 IMPLANT
KIT BASIN OR (CUSTOM PROCEDURE TRAY) ×4 IMPLANT
MANIFOLD NEPTUNE II (INSTRUMENTS) ×4 IMPLANT
NEEDLE TROCAR 18X15 ECHO (NEEDLE) ×4 IMPLANT
NEEDLE TROCAR 18X20 (NEEDLE) IMPLANT
NS IRRIG 1000ML POUR BTL (IV SOLUTION) IMPLANT
PACK BASIC VI WITH GOWN DISP (CUSTOM PROCEDURE TRAY) ×4 IMPLANT
PACK CYSTO (CUSTOM PROCEDURE TRAY) ×4 IMPLANT
PAD ABD 8X10 STRL (GAUZE/BANDAGES/DRESSINGS) ×4 IMPLANT
PROBE LITHOCLAST ULTRA 3.8X403 (UROLOGICAL SUPPLIES) ×4 IMPLANT
PROBE PNEUMATIC 1.0MMX570MM (UROLOGICAL SUPPLIES) IMPLANT
SET IRRIG Y TYPE TUR BLADDER L (SET/KITS/TRAYS/PACK) ×4 IMPLANT
SET WARMING FLUID IRRIGATION (MISCELLANEOUS) IMPLANT
SHEATH PEELAWAY SET 9 (SHEATH) ×4 IMPLANT
SPONGE LAP 4X18 X RAY DECT (DISPOSABLE) ×4 IMPLANT
STENT CONTOUR 7FRX28X.038 (STENTS) ×4 IMPLANT
STONE CATCHER W/TUBE ADAPTER (UROLOGICAL SUPPLIES) ×4 IMPLANT
SUT SILK 0 FSL (SUTURE) ×4 IMPLANT
SYR 20CC LL (SYRINGE) ×8 IMPLANT
SYRINGE 10CC LL (SYRINGE) ×4 IMPLANT
TOWEL OR 17X26 10 PK STRL BLUE (TOWEL DISPOSABLE) ×4 IMPLANT
TUBING CONNECTING 10 (TUBING) ×6 IMPLANT
TUBING CONNECTING 10' (TUBING) ×2
WATER STERILE IRR 1500ML POUR (IV SOLUTION) IMPLANT

## 2013-12-23 NOTE — Anesthesia Postprocedure Evaluation (Signed)
  Anesthesia Post-op Note  Patient: Randall HornerBruce [name]  Procedure(s) Performed: Procedure(s) (LRB): RIGHT PERCUTANEOUS NEPHROLITHOTOMY WITH SURGEON ACCESS (Right)  Patient Location: PACU  Anesthesia Type: General  Level of Consciousness: awake and alert   Airway and Oxygen Therapy: Patient Spontanous Breathing  Post-op Pain: mild  Post-op Assessment: Post-op Vital signs reviewed, Patient's Cardiovascular Status Stable, Respiratory Function Stable, Patent Airway and No signs of Nausea or vomiting  Last Vitals:  Filed Vitals:   12/23/13 1145  BP:   Pulse: 95  Temp: 36.8 C  Resp: 17    Post-op Vital Signs: stable   Complications: No apparent anesthesia complications

## 2013-12-23 NOTE — Plan of Care (Signed)
Problem: Phase II Progression Outcomes Goal: Dressings dry/intact Outcome: Completed/Met Date Met:  12/23/13 Goal: Tolerating diet Outcome: Completed/Met Date Met:  12/23/13

## 2013-12-23 NOTE — Transfer of Care (Signed)
Immediate Anesthesia Transfer of Care Note  Patient: Randall [name]  Procedure(s) Performed: Procedure(s): RIGHT PERCUTANEOUS NEPHROLITHOTOMY WITH SURGEON ACCESS (Right) HOLMIUM LASER APPLICATION (N/A)  Patient Location: PACU  Anesthesia Type:General  Level of Consciousness: awake, sedated and patient cooperative  Airway & Oxygen Therapy: Patient Spontanous Breathing and Patient connected to face mask oxygen  Post-op Assessment: Report given to PACU RN and Post -op Vital signs reviewed and stable  Post vital signs: Reviewed and stable  Complications: No apparent anesthesia complications

## 2013-12-23 NOTE — Interval H&P Note (Signed)
History and Physical Interval Note:  12/23/2013 7:37 AM  Randall HornerBruce Nau  has presented today for surgery, with the diagnosis of RIGHT RENAL CALCULUS  The various methods of treatment have been discussed with the patient and family. After consideration of risks, benefits and other options for treatment, the patient has consented to  Procedure(s): RIGHT PERCUTANEOUS NEPHROLITHOTOMY WITH SURGEON ACCESS (Right) HOLMIUM LASER APPLICATION (N/A) as a surgical intervention .  The patient's history has been reviewed, patient examined, no change in status, stable for surgery.  I have reviewed the patient's chart and labs.  Questions were answered to the patient's satisfaction.     Berniece SalinesHERRICK, Jiayi Lengacher W

## 2013-12-23 NOTE — H&P (Signed)
Reason For Visit right renal calculus   History of Present Illness Randall [name] seen in f/u from the ED were he was found to have an 18mm stone in the right renal pelvis.  The patient states that he's had intermittent right flank/right abdominal pain for 2 months. Pain seemed to be worse with movement and lifting heavy furniture (works as Scientist, forensicmover).  Naproxen helps his symptoms.   Since seen in the emergency department the patient states that his pain is worse at night when the patient is trying to sleep. During the day he reports little flank pain. However, he has had a hard time sleeping. He denies any gross hematuria. He denies any fevers or chills. He denies any changes to his voiding symptoms.   The patient has no history of kidney stones. He has no family history of kidney stones. He has no history of recurrent urinary tract infections. The patient several from bipolar disorder, is on disability for this, and takes Depakote for his symptoms. Otherwise, the patient does not take any medications. He is otherwise healthy. He has not had any other surgeries.    In ED labs were all wnl.   Bun/Cr - 13/0.98   Past Medical History Problems  1. History of Bipolar disorder (F31.9)  Surgical History Problems  1. History of No Surgical Problems  Current Meds 1. Ibuprofen 400 MG Oral Tablet;  Therapy: (Recorded:04Nov2015) to Recorded  Allergies Medication  1. No Known Drug Allergies  Family History Problems  1. Family history of Death of family member : Mother, Father   Mother at age 36; lung cancerFather at age 36; heart attack 2. Family history of lung cancer (Z80.1) : Mother 3. Family history of myocardial infarction (Z82.49) : Father  Social History Problems    Alcohol use (F10.99)   2   Current every day smoker (F17.200)   1 ppd for 19 years  Review of Systems Genitourinary, constitutional, skin, eye, otolaryngeal, hematologic/lymphatic, cardiovascular, pulmonary, endocrine,  musculoskeletal, gastrointestinal, neurological and psychiatric system(s) were reviewed and pertinent findings if present are noted.  Genitourinary: nocturia.    Vitals Vital Signs [Data Includes: Last 1 Day]  Recorded: 05Nov2015 10:35AM  Blood Pressure: 124 / 74 Temperature: 98.3 F Heart Rate: 76  Physical Exam Constitutional: Well nourished and well developed . No acute distress.  ENT:. The ears and nose are normal in appearance.  Neck: The appearance of the neck is normal and no neck mass is present.  Pulmonary: No respiratory distress and normal respiratory rhythm and effort.  Cardiovascular: Heart rate and rhythm are normal . No peripheral edema.  Abdomen: The abdomen is soft and nontender. No masses are palpated. No CVA tenderness. No hernias are palpable. No hepatosplenomegaly noted.  Skin: Normal skin turgor, no visible rash and no visible skin lesions.  Neuro/Psych:. Mood and affect are appropriate.    Results/Data Urine [Data Includes: Last 1 Day]   05Nov2015  COLOR AMBER   APPEARANCE CLEAR   SPECIFIC GRAVITY 1.025   pH 5.5   GLUCOSE NEG mg/dL  BILIRUBIN NEG   KETONE NEG mg/dL  BLOOD LARGE   PROTEIN 100 mg/dL  UROBILINOGEN 2 mg/dL  NITRITE NEG   LEUKOCYTE ESTERASE TRACE   SQUAMOUS EPITHELIAL/HPF RARE   WBC 0-2 WBC/hpf  RBC 11-20 RBC/hpf  BACTERIA RARE   CRYSTALS NONE SEEN   CASTS NONE SEEN    Patient urinalysis today is been reviewed, cultured and sent.   Assessment Assessed  1. Renal calculus, right (N20.0)  Patient  has an 18 mm renal pelvic stone on the right. No additional calculi.   Plan Health Maintenance  1. UA With REFLEX; [Do Not Release]; Status:Complete;   Done: 05Nov2015 09:33AM Renal calculus, right  2. Start: Oxycodone-Acetaminophen 5-325 MG Oral Tablet; TAKE 1 TO 2 TABLETS EVERY 4  TO 6 HOURS AS NEEDED FOR PAIN 3. Follow-up Schedule Surgery Office  Follow-up  Status: Hold For - Appointment   Requested for: 05Nov2015 4. URINE CULTURE;  Status:In Progress - Specimen/Data Collected;   Done: 05Nov2015  Discussion/Summary I discussed the options for managing none obstructing kidney stones. He understands that his stone is likely to increase in size and that over time this may cause obstructing to part of his kidney resulting in decreased renal function.I went over conservative management consisting of surviellence with serial imaging with KUB and renal ultrasound to assess for interval growth. We also discussed more aggressive interventions including Shockwave lithotripsy and ureteroscopy. I briefly explained to him what the different treatment modalities consisted of, and what he might expect with each treatment. Given the size of the stone currently, I suspect he will need a staged procedure. Shockwave lithotripsy or ureteroscopy. However, he would likely have a better chance of being stone free following percutaneous nephrolithotomy. Haven't been getting these options, the patient would like to proceed with PCNL. I have gone over the procedure with the patient detail. I described the associated risk and complications of the operation including bleeding from the kidney, damage to the surrounding areas including his liver, colon, duodenum. I also described or deformity patient that he will have a stent postoperatively which is associated with urinary frequency, bladder pressure, and dysuria. Further, the patient will have incisional pain and likely will not be able to work for 3 or 4 weeks following the procedure. Having her all the risks and benefits the patient would like to proceed with the operation. We'll get him scheduled at his earliest convenience.

## 2013-12-23 NOTE — Progress Notes (Signed)
Assumed care of pt, no change from assessment, A&O, eating lunch

## 2013-12-23 NOTE — Plan of Care (Signed)
Problem: Phase I Progression Outcomes Goal: Pain controlled with appropriate interventions Outcome: Completed/Met Date Met:  12/23/13 Goal: OOB as tolerated unless otherwise ordered Outcome: Completed/Met Date Met:  12/23/13 Goal: Incision/dressings dry and intact Outcome: Completed/Met Date Met:  12/23/13 Goal: Tubes/drains patent Outcome: Completed/Met Date Met:  12/23/13 Goal: Vital signs/hemodynamically stable Outcome: Completed/Met Date Met:  12/23/13

## 2013-12-23 NOTE — Care Management Note (Addendum)
    Page 1 of 1   12/24/2013     11:11:28 AM CARE MANAGEMENT NOTE 12/24/2013  Patient:  Randall [name],Randall   Account Number:  [id number]  Date Initiated:  12/23/2013  Documentation initiated by:  Lanier ClamMAHABIR,Beck Cofer  Subjective/Objective Assessment:   38 Y/O M ADMITTED W/R NEPHROLITHIASIS.     Action/Plan:   FROM HOME.   Anticipated DC Date:  12/24/2013   Anticipated DC Plan:  HOME/SELF CARE      DC Planning Services  CM consult      Choice offered to / List presented to:             Status of service:  Completed, signed off Medicare Important Message given?   (If response is "NO", the following Medicare IM given date fields will be blank) Date Medicare IM given:   Medicare IM given by:   Date Additional Medicare IM given:   Additional Medicare IM given by:    Discharge Disposition:  HOME/SELF CARE  Per UR Regulation:  Reviewed for med. necessity/level of care/duration of stay  If discussed at Long Length of Stay Meetings, dates discussed:    Comments:  12/24/13 Sidnie Swalley RN,BSN NCM [phone] D/C HOME NO ORDERS OR NEEDS.  12/23/13 Naoki Migliaccio RN,BSN NCM [phone] S/P R NEPHROLITHOTOMY,R URETERAL STENT.NO ANTICIPATED D/C NEEDS.

## 2013-12-23 NOTE — Op Note (Signed)
Pre-operative diagnosis: right renal pelvis stones < 2.0  Post-operative diagnosis: as above   Procedure performed: cystoscopy, right retrograde pyelogram with interpretation, right percutaneous renal access, right nephrolithotomy, right nephrostogram, right ureteral stent placement   Surgeon: Dr. Ardis Hughs  Assistant: Dr. Curt Bears  Anesthesia: General  Complications: None  Findings: 1. Mid-pole posterior calyx accessed, stone removed with lithoclast, 7Fx28cm JJ stent left in ureter   EBL: Approximately 100 cc  Specimens: stone from collecting system - taken to [facility] lab  Indication: Randall [name] is a 38 y.o. patient with a large right UPJ stone. After reviewing the management options for treatment, he elected to proceed with the above surgical procedure(s). We have discussed the potential benefits and risks of the procedure, side effects of the proposed treatment, the likelihood of the patient achieving the goals of the procedure, and any potential problems that might occur during the procedure or recuperation. Informed consent has been obtained.   Description:  Consent was obtained in the preoperative holding area. The patient was marked appropriately and then taken back to the operating room where she was intubated on the gurney. The patient was flipped prone onto the split leg OR table. Large jelly rolls were placed in the anterior axillary line on both sides allowing the patient's chest and abdomen to fall inbetween. The patient was then prepped and draped in the routine sterile fashion in the right flank and perineal/vaginal area. A timeout was then held confirming the proper side and procedure as well as antibiotics were administered.  I then used the flexible cystoscope and passed it gently into the patient's urethra under visual guidance. Once into the bladder I passed a 0.38 sensor wire through right UO and into the right collecting system. I then  removed scope over the wire and then exchanged the wire for a 5 [place] open-ended ureteral Pollock catheter. The Pollack catheter was then advanced up to the UPJ. The wire was then removed and a retrograde pyelogram was performed with the above findings.  A 45F foley catheter was then placed.  I then turned my attention to the patient's right flank and obtaining percutaneous renal access.  Using the C-arm rotated at 25 and the bulls-eye technique with an 18-gauge coaxial needle the mid-pole posterior lateral calyx was targeted. Then rotating the C-arm APthe  depth of our needle was noted to be within the calyx and the inner part of the coaxial needle was removed. Urine was noted to return. A 0.38 sensor wire was then passed through the sheath of the coaxial needle and into the right renal collecting system. The wire was then passed down the ureter and into the bladder using fluoroscopic guide and the sheath of the needle was removed. An angiographic catheter was then advanced into the bladder over the wire and the wire removed. A Super Stiff wire was then passed into the angiographic catheter and the angiographic catheter removed. A 8-10 French peel-away dilator was then advanced over the Super Stiff wire and passed into the renal pelvis and across the UPJ under fluoroscopic guidance. The inner part of this dilator was removed and the 0.38 sensor wire was passed alongside the Super Stiff wire through the dilator and into the right ureter and down into the bladder. The angiographic catheter again was passed over the guidewire and advanced into the bladder, the wire was then removed. A Super Stiff wire was then passed through angiographic catheter and angiographic catheter removed. The outer part of  the sheath was then removed, establishing 2 superstiff wires through the lower pole calyx and into the bladder.   The 74 French NephroMax balloon was then passed over one of the Super Stiff wires and the tip guided  down into the right upper pole calyx. The balloon was then inflated to approximately 12 atm, and once there was no waist noted under fluoroscopy the access sheath was advanced over the balloon. The balloon was then removed. The wires were then placed back into the sheaths and snapped to the drape.   Using the rigid nephroscope to explore the renal pelvis I encountered the large calculus which I was able to remove with the lithoclast. Then using a flexible cystoscope to navigate the remaining calyces of the kidney multiple smaller stone fragments encountered, mostly in the lower pole calyces. The ellipses were noted to be long and narrow. Using the 0 tip basket these fragments were grabbed and removed. Contrast was injected through the cystoscope and the calyces systematically inspected under fluoroscopic guidance to ensure that all stone fragments had been removed.   Then using the flexible ureteroscope the ureter was navigated in antegrade fashion. All stone fragments were pushed from the ureter into the bladder. Once the ureter was clear the scope was advanced into the bladder and a 0.3 sensor wire was left in the bladder and the scope backed out over wire. The sensor wire was then backloaded over the rigid nephroscope using the stent pusher and a 28cm x 7 French double-J ureteral stent was passed antegrade over the sensor wire down into the bladder under fluoroscopic guidance. Once the stent was in the bladder the wire was gently pulled back and a nice curl noted in the bladder. The wire completely removed from the stent, and nice curl on the proximal end of the stent was noted in the renal pelvis. The sheath was then backed out slowly to ensure that all calyces had been inspected and there was nothing behind the sheath.   A 24 [place] council tip catheter was then passed over one of the Super Stiff wires through the sheath and into the renal pelvis. The sheath was then backed out of the kidney and cut off  the red rubber catheter. A nephrostogram was then performed confirming the position of our nephrostomy tube and reassuring that there were no longer any filling defects from the patient's symptoms or perforation of the renal pelvis. After several minutes of direct pressure and observation was noted that there was no significant bleeding from the nephrostomy tube or around the nephrostomy tube tract. As such, I remove the nephrostomy tube as well as the safety wire. 25 cc of local anesthesia was then injected into the patient's wound, and the wound was closed with 3-0 silk in 2 vertical mattress sutures. The incision was then padded using a bundle of 4 x 4's and Hypafix tape. Patient was subsequently rolled over to the supine position and extubated. She was returned to the PACU in excellent condition. At the end of the case all lap and needle and sponges were accounted for. There are no perioperative complications.

## 2013-12-23 NOTE — Discharge Instructions (Signed)
Discharge instructions following PCNL  Call your doctor for: Fevers greater than 100.5 Severe nausea or vomiting Increasing pain not controlled by pain medication Increasing redness or drainage from incisions Decreased urine output or a catheter is no longer draining  The number for questions is [phone].  Activity: Gradually increase activity with short frequent walks, 3-4 times a day.  Avoid strenuous activities, like sports, lawn-mowing, or heavy lifting (more than 10-15 pounds).  Wear loose, comfortable clothing that pull or kink the tube or tubes.  Do not drive while taking pain medication, or until your doctor permitts it.  Drainage bag care: The drainage bag should be secured such that it never pulls or loosens to prevent it from leaking.  It is important to wash her hands before and after emptying the drainage bag to help prevent the spread of infection.  The drainage bag should be emptied as needed.  When the wound stops draining or it is manageable with a dry gauze dressing, you can remove the bag.  Diet: It is extremely important to drink plenty of fluids after surgery, especially water.  You may resume your regular diet, unless otherwise instructed.  Medications: May take Tylenol (acetaminophen) or ibuprofen (Advil, Motrin) as directed over-the-counter. Take any prescriptions as directed.  Follow-up appointments: Follow-up appointment will be scheduled with Dr. Marlou PorchHerrick in 10-14 days for hospital check and stent removal.

## 2013-12-24 ENCOUNTER — Encounter (HOSPITAL_COMMUNITY): Payer: Self-pay | Admitting: Urology

## 2013-12-24 DIAGNOSIS — N200 Calculus of kidney: Secondary | ICD-10-CM | POA: Diagnosis not present

## 2013-12-24 LAB — CBC
HCT: 36.5 % — ABNORMAL LOW (ref 39.0–52.0)
Hemoglobin: 12 g/dL — ABNORMAL LOW (ref 13.0–17.0)
MCH: 29.7 pg (ref 26.0–34.0)
MCHC: 32.9 g/dL (ref 30.0–36.0)
MCV: 90.3 fL (ref 78.0–100.0)
PLATELETS: 199 10*3/uL (ref 150–400)
RBC: 4.04 MIL/uL — AB (ref 4.22–5.81)
RDW: 13.2 % (ref 11.5–15.5)
WBC: 9 10*3/uL (ref 4.0–10.5)

## 2013-12-24 LAB — BASIC METABOLIC PANEL
ANION GAP: 10 (ref 5–15)
BUN: 11 mg/dL (ref 6–23)
CALCIUM: 8.6 mg/dL (ref 8.4–10.5)
CO2: 27 meq/L (ref 19–32)
Chloride: 102 mEq/L (ref 96–112)
Creatinine, Ser: 1.25 mg/dL (ref 0.50–1.35)
GFR calc Af Amer: 84 mL/min — ABNORMAL LOW (ref >90)
GFR, EST NON AFRICAN AMERICAN: 73 mL/min — AB (ref >90)
GLUCOSE: 125 mg/dL — AB (ref 70–99)
Potassium: 3.8 mEq/L (ref 3.7–5.3)
Sodium: 139 mEq/L (ref 137–147)

## 2013-12-24 MED ORDER — OXYCODONE HCL 5 MG PO TABS: 5.0000 mg | ORAL_TABLET | ORAL | Status: DC | PRN

## 2013-12-24 MED ORDER — NAPROXEN SODIUM 220 MG PO TABS: 220.0000 mg | ORAL_TABLET | Freq: Two times a day (BID) | ORAL | Status: DC

## 2013-12-24 MED ORDER — DSS 100 MG PO CAPS: 100.0000 mg | ORAL_CAPSULE | Freq: Two times a day (BID) | ORAL | Status: DC

## 2013-12-24 NOTE — Progress Notes (Signed)
Discharge instruction reviewed with patient utilizing teach back method no questions at this time. Patient discharged to home. 

## 2013-12-24 NOTE — Progress Notes (Signed)
1 Day Post-Op Subjective: Pain well controlled overnight, ambulating, foley out but hasn't voided yet.  Overall, feeling great.  Objective: Vital signs in last 24 hours: Temp:  [97.5 F (36.4 C)-98.6 F (37 C)] 98.6 F (37 C) (12/02 0528) Pulse Rate:  [74-108] 74 (12/02 0528) Resp:  [15-19] 18 (12/02 0528) BP: (101-157)/(71-97) 118/72 mmHg (12/02 0528) SpO2:  [94 %-100 %] 95 % (12/02 0528)  Intake/Output from previous day: 12/01 0701 - 12/02 0700 In: 4301.3 [P.O.:480; I.V.:3721.3; IV Piggyback:100] Out: 2630 [Urine:2430; Blood:200] Intake/Output this shift:    Physical Exam:  General: Alert and oriented CV: RRR Lungs: Clear Abdomen: Soft, ND Incisions: Dressing over PCNL incision is dry without any staining Ext: NT, No erythema  Lab Results:  Recent Labs  12/22/13 1000 12/23/13 1146 12/24/13 0415  HGB 15.0 12.9* 12.0*  HCT 44.6 39.2 36.5*   BMET  Recent Labs  12/23/13 1146 12/24/13 0415  NA 138 139  K 4.0 3.8  CL 103 102  CO2 25 27  GLUCOSE 127* 125*  BUN 10 11  CREATININE 1.36* 1.25  CALCIUM 8.7 8.6     Studies/Results: Dg C-arm 61-120 Min-no Report  12/23/2013   CLINICAL DATA: surgery   C-ARM 61-120 MINUTES  Fluoroscopy was utilized by the requesting physician.  No radiographic  interpretation.     Assessment/Plan: 38 yo POD1 from right PCNL.  Tolerated the procedure well.  Hgb declined from 15 to 12.9 to 12 but in context of recent operation and stable vital signs (no significant observed bleeding peri-operatively) this is likely primarily dilutional.    Foley removed and will follow trial of void.  If he tolerates his diet and pain remains well controlled will plan to discharge later today.   LOS: 1 day   Almira BarKirby, Will 12/24/2013, 7:26 AM    I performed a history and physical examination of the patient and discussed his management with the resident.  I reviewed the resident's note and agree with the documented findings and plan of care.

## 2013-12-24 NOTE — Discharge Summary (Signed)
Date of admission: 12/23/2013  Date of discharge: 12/24/2013  Admission diagnosis: right kidney stone  Discharge diagnosis: same  Secondary diagnoses:  Patient Active Problem List   Diagnosis Date Noted  . Nephrolithiasis 12/23/2013    History and Physical: For full details, please see admission history and physical. Briefly, Randall [name] is a 38 y.o. year old patient with large right renal stone.   Hospital Course: Patient tolerated the procedure well.  He was then transferred to the floor after an uneventful PACU stay.  His hospital course was uncomplicated.  On POD#1  he had met discharge criteria: was eating a regular diet, was up and ambulating independently,  pain was well controlled, was voiding without a catheter, and was ready to for discharge.   Laboratory values:   Recent Labs  12/22/13 1000 12/23/13 1146 12/24/13 0415  WBC 8.6 7.9 9.0  HGB 15.0 12.9* 12.0*  HCT 44.6 39.2 36.5*    Recent Labs  12/22/13 1010 12/23/13 1146 12/24/13 0415  NA 136* 138 139  K 4.1 4.0 3.8  CL 98 103 102  CO2 27 25 27  GLUCOSE 95 127* 125*  BUN 10 10 11  CREATININE 1.00 1.36* 1.25  CALCIUM 9.7 8.7 8.6   No results for input(s): LABPT, INR in the last 72 hours. No results for input(s): LABURIN in the last 72 hours. Results for orders placed or performed during the hospital encounter of 12/22/13  Urine culture     Status: None   Collection Time: 12/22/13  9:11 AM  Result Value Ref Range Status   Specimen Description URINE, CLEAN CATCH  Final   Special Requests NONE  Final   Culture  Setup Time   Final    12/22/2013 14:14 Performed at [facility]    Colony Count NO GROWTH Performed at [facility]   Final   Culture NO GROWTH Performed at [facility]   Final   Report Status 12/23/2013 FINAL  Final    Disposition: Home  Discharge instruction: The patient was instructed to be ambulatory but told to refrain from heavy lifting, strenuous activity,  or driving.   Discharge medications:   Medication List    STOP taking these medications        traMADol 50 MG tablet  Commonly known as:  ULTRAM      TAKE these medications        DSS 100 MG Caps  Take 100 mg by mouth 2 (two) times daily.     naproxen sodium 220 MG tablet  Commonly known as:  ALEVE  Take 1 tablet (220 mg total) by mouth 2 (two) times daily with a meal.     oxyCODONE 5 MG immediate release tablet  Commonly known as:  Oxy IR/ROXICODONE  Take 1-2 tablets (5-10 mg total) by mouth every 4 (four) hours as needed for severe pain.        Followup:      Follow-up Information    Follow up with ,  W, MD On 01/01/2014.   Specialty:  Urology   Why:  9:30a   Contact information:   [address] [place] [address] [phone]        

## 2015-06-04 DIAGNOSIS — F250 Schizoaffective disorder, bipolar type: Secondary | ICD-10-CM | POA: Diagnosis not present

## 2015-06-08 DIAGNOSIS — F250 Schizoaffective disorder, bipolar type: Secondary | ICD-10-CM | POA: Diagnosis not present

## 2015-06-09 DIAGNOSIS — F250 Schizoaffective disorder, bipolar type: Secondary | ICD-10-CM | POA: Diagnosis not present

## 2015-07-07 DIAGNOSIS — F250 Schizoaffective disorder, bipolar type: Secondary | ICD-10-CM | POA: Diagnosis not present

## 2015-07-09 DIAGNOSIS — F250 Schizoaffective disorder, bipolar type: Secondary | ICD-10-CM | POA: Diagnosis not present

## 2015-07-21 DIAGNOSIS — F250 Schizoaffective disorder, bipolar type: Secondary | ICD-10-CM | POA: Diagnosis not present

## 2015-08-05 DIAGNOSIS — F250 Schizoaffective disorder, bipolar type: Secondary | ICD-10-CM | POA: Diagnosis not present

## 2015-09-01 DIAGNOSIS — F250 Schizoaffective disorder, bipolar type: Secondary | ICD-10-CM | POA: Diagnosis not present

## 2015-12-22 DIAGNOSIS — F250 Schizoaffective disorder, bipolar type: Secondary | ICD-10-CM | POA: Diagnosis not present

## 2016-01-18 DIAGNOSIS — F1220 Cannabis dependence, uncomplicated: Secondary | ICD-10-CM | POA: Diagnosis not present

## 2016-01-18 DIAGNOSIS — F1020 Alcohol dependence, uncomplicated: Secondary | ICD-10-CM | POA: Diagnosis not present

## 2016-01-18 DIAGNOSIS — F250 Schizoaffective disorder, bipolar type: Secondary | ICD-10-CM | POA: Diagnosis not present

## 2016-01-18 DIAGNOSIS — F6381 Intermittent explosive disorder: Secondary | ICD-10-CM | POA: Diagnosis not present

## 2016-02-21 DIAGNOSIS — F250 Schizoaffective disorder, bipolar type: Secondary | ICD-10-CM | POA: Diagnosis not present

## 2016-02-25 DIAGNOSIS — F250 Schizoaffective disorder, bipolar type: Secondary | ICD-10-CM | POA: Diagnosis not present

## 2016-03-17 DIAGNOSIS — F250 Schizoaffective disorder, bipolar type: Secondary | ICD-10-CM | POA: Diagnosis not present

## 2016-03-22 DIAGNOSIS — F250 Schizoaffective disorder, bipolar type: Secondary | ICD-10-CM | POA: Diagnosis not present

## 2016-04-14 DIAGNOSIS — F250 Schizoaffective disorder, bipolar type: Secondary | ICD-10-CM | POA: Diagnosis not present

## 2016-05-22 DIAGNOSIS — F250 Schizoaffective disorder, bipolar type: Secondary | ICD-10-CM | POA: Diagnosis not present

## 2016-06-20 DIAGNOSIS — F250 Schizoaffective disorder, bipolar type: Secondary | ICD-10-CM | POA: Diagnosis not present

## 2016-07-21 DIAGNOSIS — F250 Schizoaffective disorder, bipolar type: Secondary | ICD-10-CM | POA: Diagnosis not present

## 2016-08-22 IMAGING — CT CT ABD-PELV W/O CM
2 of 4 series · 16 of 46 positions shown, 18 images · non-contrast
Comparison: None.

CLINICAL DATA: Initial encounter for acute left flank pain.

EXAM:
CT ABDOMEN AND PELVIS WITHOUT CONTRAST
TECHNIQUE: Multidetector CT imaging of the abdomen and pelvis was performed
following the standard protocol without IV contrast.

[Series 2: stone study 5.0 i30f 1 · axial · 0.84mm/px · z∈[-1171,-731]mm · 13 of 97 slices shown, 15 images]
[im 5/97  soft-tissue]
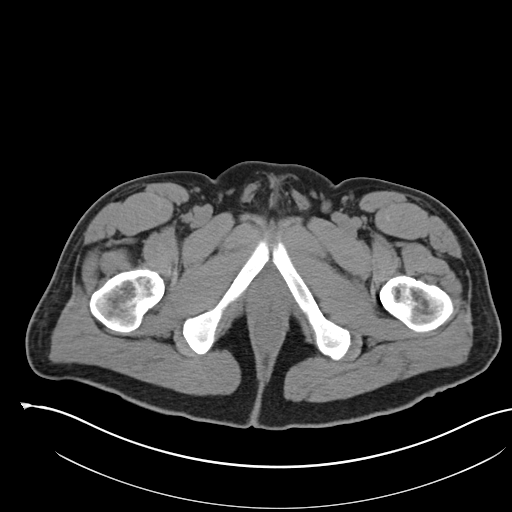
[im 5/97  bone]
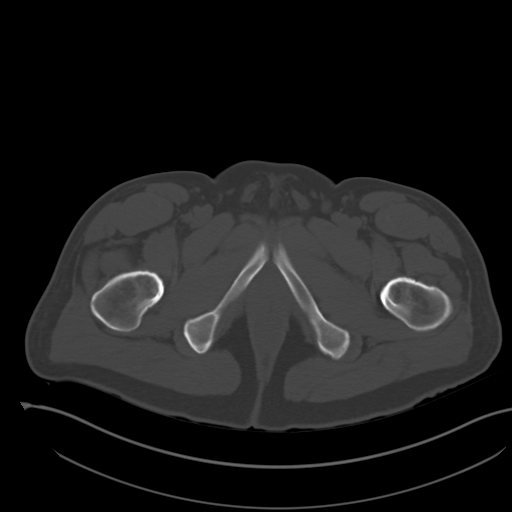
[im 13/97  soft-tissue]
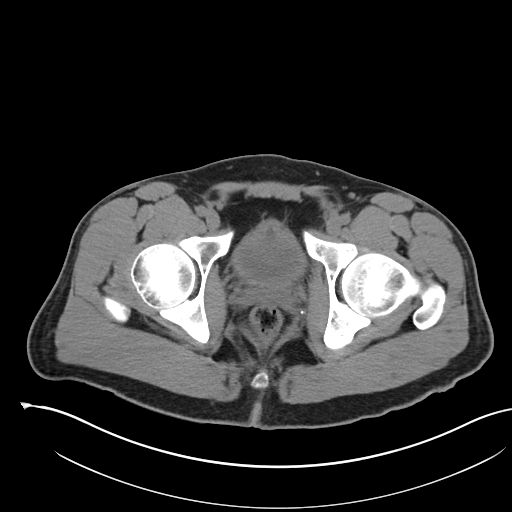
[im 21/97  soft-tissue]
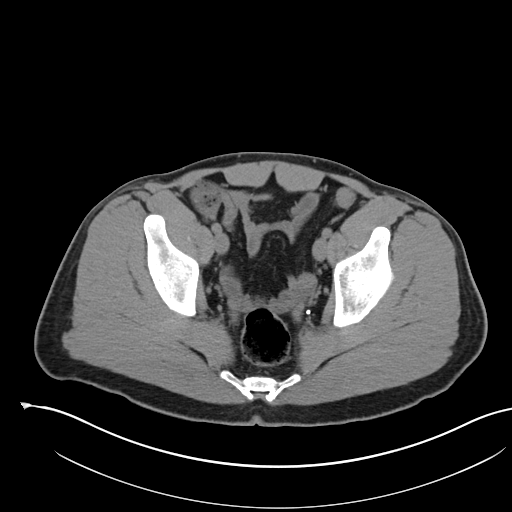
[im 29/97  soft-tissue]
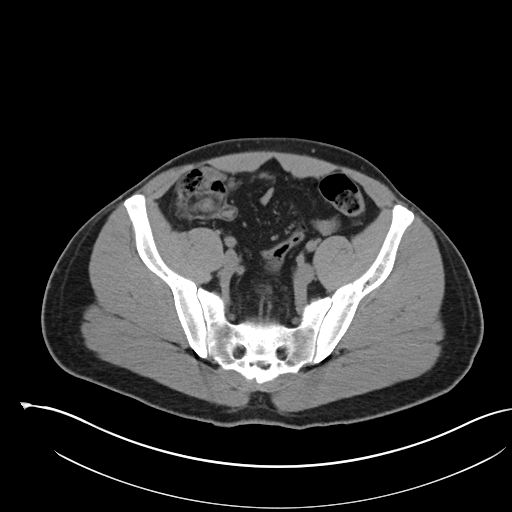
[im 33/97  soft-tissue]
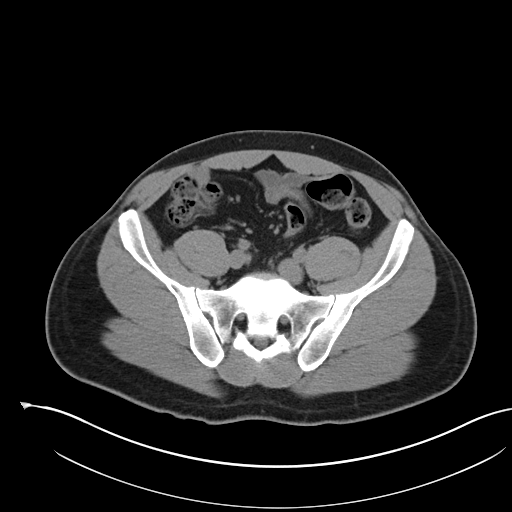
[im 41/97  soft-tissue]
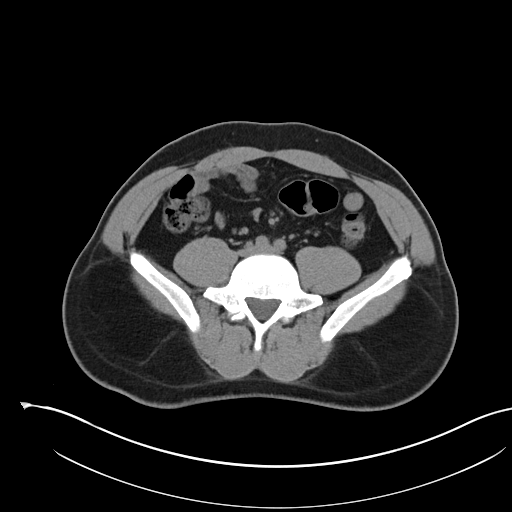
[im 49/97  soft-tissue]
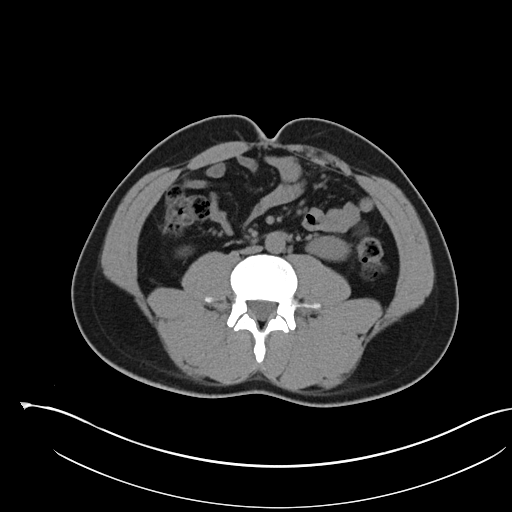
[im 57/97  soft-tissue]
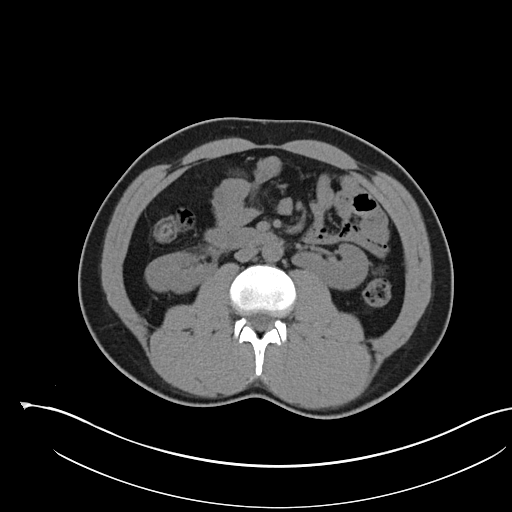
[im 65/97  soft-tissue]
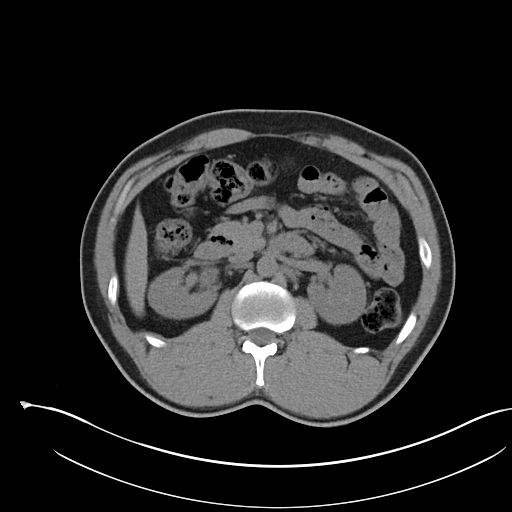
[im 65/97  bone]
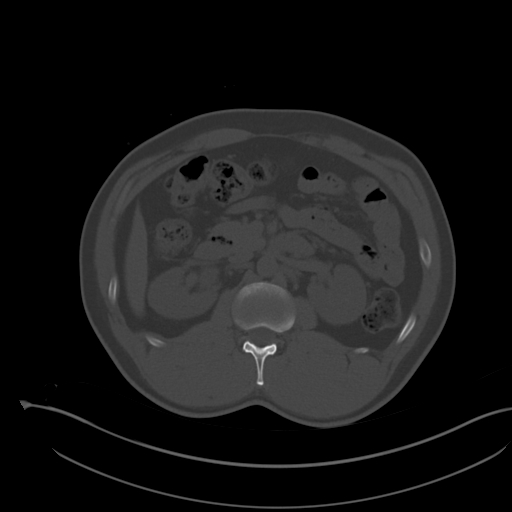
[im 69/97  soft-tissue]
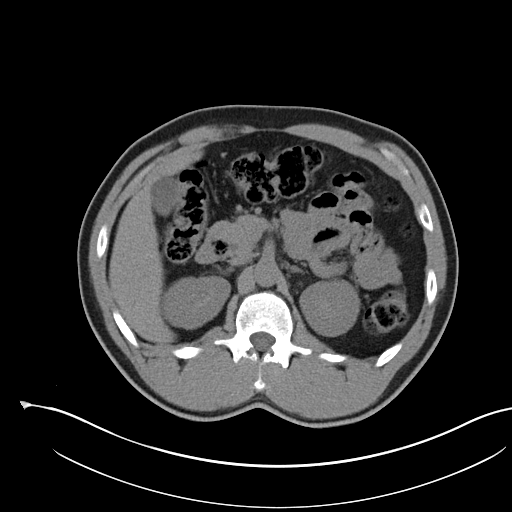
[im 77/97  soft-tissue]
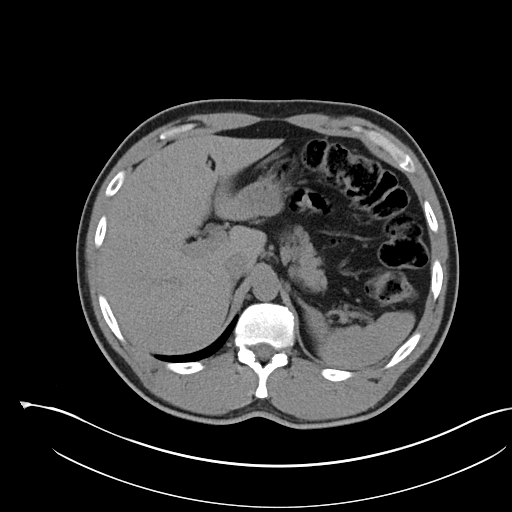
[im 85/97  soft-tissue]
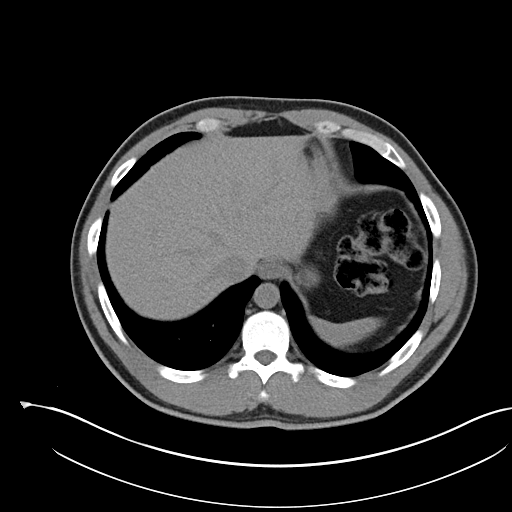
[im 93/97  soft-tissue]
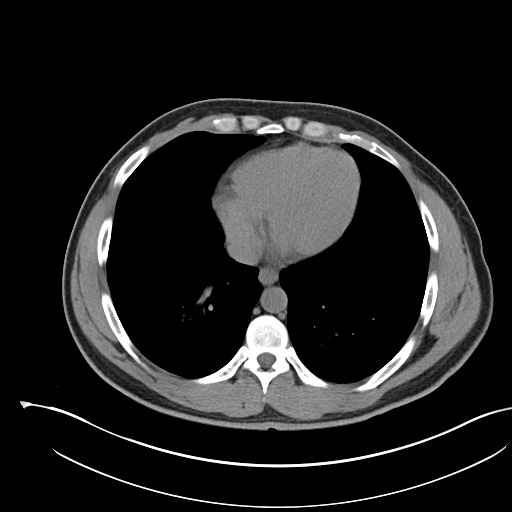

[Series 5: coronal soft tissue · coronal · 0.77mm/px · 3 of 82 slices shown]
[im 28/82  soft-tissue]
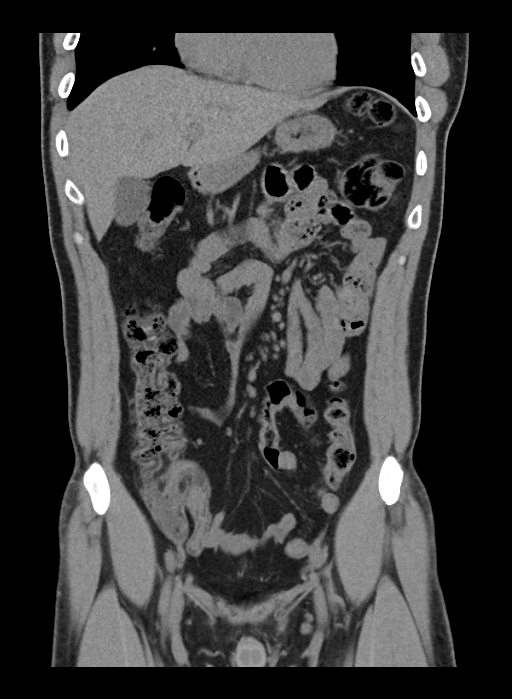
[im 37/82  soft-tissue]
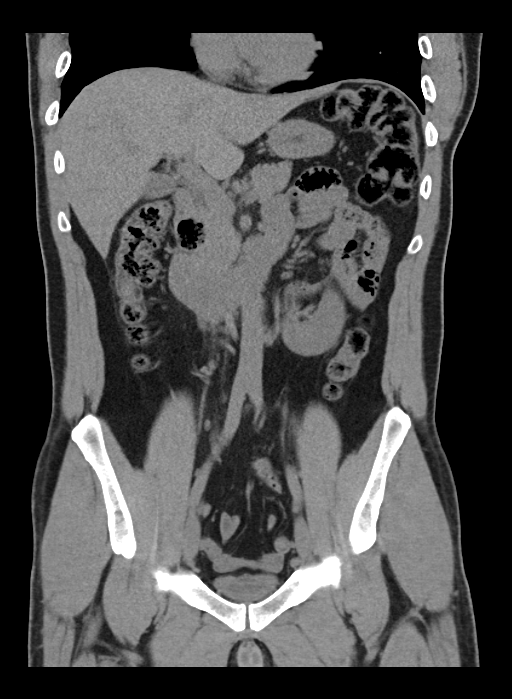
[im 46/82  soft-tissue]
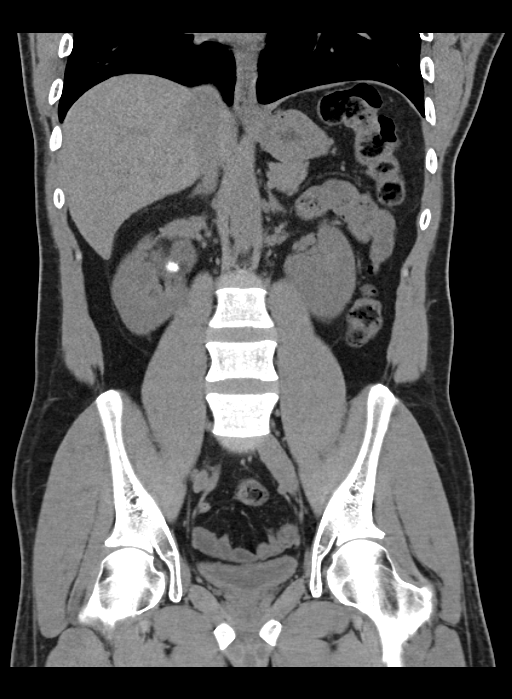

[16 of 46 positions shown; findings below may reference images not displayed]

FINDINGS: Lower chest: Chronic interstitial change seen in the lung bases
likely related to smoking.

Hepatobiliary: No focal abnormality in the liver on this study
without intravenous contrast. No evidence for hepatomegaly. There is
no evidence for gallstones, gallbladder wall thickening, or
pericholecystic fluid. No intrahepatic or extrahepatic biliary
dilation.

Pancreas: No focal mass lesion. No dilatation of the main duct. No
intraparenchymal cyst. No peripancreatic edema.

Spleen: No splenomegaly. No focal mass lesion.

Adrenals/Urinary Tract: No adrenal nodule or mass. Right kidney is
malrotated with a 18 x 12 x 15 mm nonobstructing stone in the renal
pelvis. Left kidney is also malrotated without evidence for stone
disease. No evidence for ureteral or bladder stones.

Stomach/Bowel: Stomach is nondistended. No gastric wall thickening.
No evidence of outlet obstruction. Duodenum diverticulum evident. No
small bowel wall thickening. No small bowel dilatation. Terminal
ileum is normal. Appendix is normal. No gross colonic mass. No
colonic wall thickening. No substantial diverticular change.

Vascular/Lymphatic: No abdominal aortic aneurysm. There is
gastrohepatic or hepatoduodenal ligament lymphadenopathy. No
retroperitoneal lymphadenopathy. Pelvic sidewall lymphadenopathy.

Reproductive: No intraperitoneal free fluid.

Other: Bone windows reveal no worrisome lytic or sclerotic osseous
lesions.

Musculoskeletal: Bone windows reveal no worrisome lytic or sclerotic
osseous lesions.
IMPRESSION: 18 x 12 x 15 mm nonobstructing stone in the right renal pelvis. No
secondary changes in the right kidney.

Otherwise unremarkable exam of the abdomen and pelvis.

## 2016-08-23 DIAGNOSIS — F250 Schizoaffective disorder, bipolar type: Secondary | ICD-10-CM | POA: Diagnosis not present

## 2016-09-13 DIAGNOSIS — F250 Schizoaffective disorder, bipolar type: Secondary | ICD-10-CM | POA: Diagnosis not present

## 2016-11-24 DIAGNOSIS — F250 Schizoaffective disorder, bipolar type: Secondary | ICD-10-CM | POA: Diagnosis not present

## 2017-05-21 DIAGNOSIS — Z0000 Encounter for general adult medical examination without abnormal findings: Secondary | ICD-10-CM | POA: Diagnosis not present

## 2017-05-21 DIAGNOSIS — Z136 Encounter for screening for cardiovascular disorders: Secondary | ICD-10-CM | POA: Diagnosis not present

## 2017-05-21 DIAGNOSIS — F250 Schizoaffective disorder, bipolar type: Secondary | ICD-10-CM | POA: Diagnosis not present

## 2017-05-31 DIAGNOSIS — F250 Schizoaffective disorder, bipolar type: Secondary | ICD-10-CM | POA: Diagnosis not present

## 2017-08-28 DIAGNOSIS — F250 Schizoaffective disorder, bipolar type: Secondary | ICD-10-CM | POA: Diagnosis not present

## 2017-12-12 DIAGNOSIS — F6381 Intermittent explosive disorder: Secondary | ICD-10-CM | POA: Diagnosis not present

## 2018-02-14 DIAGNOSIS — F250 Schizoaffective disorder, bipolar type: Secondary | ICD-10-CM | POA: Diagnosis not present

## 2018-06-18 DIAGNOSIS — F6381 Intermittent explosive disorder: Secondary | ICD-10-CM | POA: Diagnosis not present

## 2018-07-22 DIAGNOSIS — Z720 Tobacco use: Secondary | ICD-10-CM | POA: Diagnosis not present

## 2018-07-22 DIAGNOSIS — E7800 Pure hypercholesterolemia, unspecified: Secondary | ICD-10-CM | POA: Diagnosis not present

## 2018-07-22 DIAGNOSIS — F250 Schizoaffective disorder, bipolar type: Secondary | ICD-10-CM | POA: Diagnosis not present

## 2018-07-22 DIAGNOSIS — Z0000 Encounter for general adult medical examination without abnormal findings: Secondary | ICD-10-CM | POA: Diagnosis not present

## 2018-08-05 DIAGNOSIS — F250 Schizoaffective disorder, bipolar type: Secondary | ICD-10-CM | POA: Diagnosis not present

## 2018-08-05 DIAGNOSIS — F6381 Intermittent explosive disorder: Secondary | ICD-10-CM | POA: Diagnosis not present

## 2018-08-30 ENCOUNTER — Other Ambulatory Visit: Payer: Self-pay

## 2018-08-30 ENCOUNTER — Emergency Department (HOSPITAL_COMMUNITY)
Admission: EM | Admit: 2018-08-30 | Discharge: 2018-08-30 | Disposition: A | Discharge status: Home / Self Care | Payer: Medicare HMO | Attending: Emergency Medicine | Admitting: Emergency Medicine

## 2018-08-30 ENCOUNTER — Encounter (HOSPITAL_COMMUNITY): Payer: Self-pay | Admitting: Emergency Medicine

## 2018-08-30 DIAGNOSIS — F3131 Bipolar disorder, current episode depressed, mild: Secondary | ICD-10-CM | POA: Diagnosis not present

## 2018-08-30 DIAGNOSIS — Z79899 Other long term (current) drug therapy: Secondary | ICD-10-CM | POA: Insufficient documentation

## 2018-08-30 DIAGNOSIS — F17210 Nicotine dependence, cigarettes, uncomplicated: Secondary | ICD-10-CM | POA: Diagnosis not present

## 2018-08-30 DIAGNOSIS — Z046 Encounter for general psychiatric examination, requested by authority: Secondary | ICD-10-CM | POA: Diagnosis present

## 2018-08-30 DIAGNOSIS — R45851 Suicidal ideations: Secondary | ICD-10-CM | POA: Insufficient documentation

## 2018-08-30 LAB — COMPREHENSIVE METABOLIC PANEL
ALT: 14 U/L (ref 0–44)
AST: 18 U/L (ref 15–41)
Albumin: 3.7 g/dL (ref 3.5–5.0)
Alkaline Phosphatase: 55 U/L (ref 38–126)
Anion gap: 10 (ref 5–15)
BUN: 11 mg/dL (ref 6–20)
CO2: 24 mmol/L (ref 22–32)
Calcium: 9.1 mg/dL (ref 8.9–10.3)
Chloride: 105 mmol/L (ref 98–111)
Creatinine, Ser: 1.06 mg/dL (ref 0.61–1.24)
GFR calc Af Amer: >60 mL/min (ref >60)
GFR calc non Af Amer: >60 mL/min (ref >60)
Glucose, Bld: 96 mg/dL (ref 70–99)
Potassium: 4.1 mmol/L (ref 3.5–5.1)
Sodium: 139 mmol/L (ref 135–145)
Total Bilirubin: 0.9 mg/dL (ref 0.3–1.2)
Total Protein: 7 g/dL (ref 6.5–8.1)

## 2018-08-30 LAB — CBC
HCT: 46.3 % (ref 39.0–52.0)
Hemoglobin: 14.9 g/dL (ref 13.0–17.0)
MCH: 29.8 pg (ref 26.0–34.0)
MCHC: 32.2 g/dL (ref 30.0–36.0)
MCV: 92.6 fL (ref 80.0–100.0)
Platelets: 303 10*3/uL (ref 150–400)
RBC: 5 MIL/uL (ref 4.22–5.81)
RDW: 13.4 % (ref 11.5–15.5)
WBC: 11.5 10*3/uL — ABNORMAL HIGH (ref 4.0–10.5)
nRBC: 0 % (ref 0.0–0.2)

## 2018-08-30 LAB — RAPID URINE DRUG SCREEN, HOSP PERFORMED
Amphetamines: NOT DETECTED
Barbiturates: NOT DETECTED
Benzodiazepines: NOT DETECTED
Cocaine: NOT DETECTED
Opiates: NOT DETECTED
Tetrahydrocannabinol: NOT DETECTED

## 2018-08-30 LAB — ETHANOL: Alcohol, Ethyl (B): <10 mg/dL (ref <10)

## 2018-08-30 LAB — VALPROIC ACID LEVEL: Valproic Acid Lvl: <10 ug/mL — ABNORMAL LOW (ref 50.0–100.0)

## 2018-08-30 LAB — SALICYLATE LEVEL: Salicylate Lvl: <7 mg/dL (ref 2.8–30.0)

## 2018-08-30 LAB — ACETAMINOPHEN LEVEL: Acetaminophen (Tylenol), Serum: <10 ug/mL — ABNORMAL LOW (ref 10–30)

## 2018-08-30 MED ORDER — ACETAMINOPHEN 325 MG PO TABS: 650.0000 mg | ORAL_TABLET | ORAL | Status: DC | PRN

## 2018-08-30 MED ORDER — RISPERIDONE 2 MG PO TBDP
2.0000 mg | ORAL_TABLET | Freq: Three times a day (TID) | ORAL | Status: DC | PRN
  Filled 2018-08-30: qty 1

## 2018-08-30 MED ORDER — ZIPRASIDONE MESYLATE 20 MG IM SOLR: 20.0000 mg | INTRAMUSCULAR | Status: DC | PRN

## 2018-08-30 MED ORDER — ONDANSETRON HCL 4 MG PO TABS: 4.0000 mg | ORAL_TABLET | Freq: Three times a day (TID) | ORAL | Status: DC | PRN

## 2018-08-30 MED ORDER — LORAZEPAM 1 MG PO TABS: 1.0000 mg | ORAL_TABLET | ORAL | Status: DC | PRN

## 2018-08-30 MED ORDER — ZOLPIDEM TARTRATE 5 MG PO TABS: 5.0000 mg | ORAL_TABLET | Freq: Every evening | ORAL | Status: DC | PRN

## 2018-08-30 NOTE — ED Notes (Signed)
Introduced myself to patient.  Assessment completed.  Denies feeling suicidal at this time.  States I wanted to hurt somebody when I got here but I don't have any of those feelings now.  Calm and cooperative.  Meal provided.  Encouraged to call for assistance as needed.

## 2018-08-30 NOTE — ED Triage Notes (Signed)
Pt reports suicidal thoughts with no plan, states he is taking his medications for bipolar disorder and schizophrenia without improvement.
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
## 2018-08-30 NOTE — BH Assessment (Addendum)
Tele Assessment Note   Patient Name: [name] MRN: [id number] Referring Physician: Pattricia Boss, MD Location of Patient: [facility] Location of Provider: [place] Department  Randall [name] is an 43 y.o. male who presents to the ED voluntarily. Pt reports he has been angry and irritable. Pt states he has been dx with Bipolar d/o since 2011 and he has been prescribed the same psych meds since his original dx. Pt states he does not feel that the medication is working. Pt states he was suspended from his job because he got angry at a customer that called him the N word and wanted to fight him. Pt states he feels that his temper is short and he sometimes cannot control his anger. Pt states he feels that if he does not have any changes to his medication, he will hurt someone. Pt states he goes off on "anyone that pisses me off." Pt states he has had thoughts of "beating someone to death" in the past when he has been angry. Pt denies current SI or HI. Pt endorses AH and states "they come and go." Pt states he last experienced AH 2 days ago by hearing the voice of his deceased mother.   Pt states he feels safe being d/c home. Pt states he would like referrals to OPT clinics in order to change his current OPT provider. Pt is able to contract for safety and states he doesn't have thoughts to harm self or anyone else.  Per Priscille Loveless, NP pt does not meet criteria for inpt tx. Recommends pt f/u with OPT provider. TTS to send referrals to [facility] as pt expressed he is unhappy with his current OPT provider. Pt's nurse Jennings Books, RN and EDP Pattricia Boss, MD have been advised.  Diagnosis: Bipolar d/o  Past Medical History:  Past Medical History:  Diagnosis Date  . Bipolar 1 disorder ([facility])    under control  . History of kidney stones   . Sinus drainage    seasonal-spring    Past Surgical History:  Procedure Laterality Date  . CIRCUMCISION  age 50  . NEPHROLITHOTOMY Right 12/23/2013    Procedure: RIGHT PERCUTANEOUS NEPHROLITHOTOMY WITH SURGEON ACCESS;  Surgeon: Ardis Hughs, MD;  Location: [facility];  Service: Urology;  Laterality: Right;    Family History: No family history on file.  Social History:  reports that he has been smoking cigarettes. He has been smoking about 1.00 pack per day. He has never used smokeless tobacco. He reports current alcohol use. He reports that he does not use drugs.  Additional Social History:  Alcohol / Drug Use Pain Medications: See MAR Prescriptions: See MAR Over the Counter: See MAR History of alcohol / drug use?: No history of alcohol / drug abuse  CIWA: CIWA-Ar BP: (!) 129/94 Pulse Rate: 67 COWS:    Allergies: No Known Allergies  Home Medications: (Not in a hospital admission)   OB/GYN Status:  No LMP for male patient.  General Assessment Data Location of Assessment: [facility] ED TTS Assessment: In system Is this a Tele or Face-to-Face Assessment?: Tele Assessment Is this an Initial Assessment or a Re-assessment for this encounter?: Initial Assessment Patient Accompanied by:: N/A Language Other than English: No Living Arrangements: Other (Comment) What gender do you identify as?: Male Marital status: Single Pregnancy Status: No Living Arrangements: Alone Can pt return to current living arrangement?: Yes Admission Status: Voluntary Is patient capable of signing voluntary admission?: Yes Referral Source: Self/Family/Friend Insurance type: [facility]  Crisis Care Plan Living Arrangements: Alone Name of Psychiatrist: [facility] Name of Therapist: [facility]  Education Status Is patient currently in school?: No Is the patient employed, unemployed or receiving disability?: Employed  Risk to self with the past 6 months Suicidal Ideation: No Has patient been a risk to self within the past 6 months prior to admission? : No Suicidal Intent: No Has patient had any suicidal intent within the past 6 months prior to  admission? : No Is patient at risk for suicide?: No Suicidal Plan?: No Has patient had any suicidal plan within the past 6 months prior to admission? : No Access to Means: No What has been your use of drugs/alcohol within the last 12 months?: denies use Previous Attempts/Gestures: No Triggers for Past Attempts: None known Intentional Self Injurious Behavior: None Family Suicide History: No Recent stressful life event(s): Conflict (Comment), Other (Comment)(psych meds ineffective) Persecutory voices/beliefs?: No Depression: No Depression Symptoms: Feeling angry/irritable Substance abuse history and/or treatment for substance abuse?: No Suicide prevention information given to non-admitted patients: Not applicable  Risk to Others within the past 6 months Homicidal Ideation: No Does patient have any lifetime risk of violence toward others beyond the six months prior to admission? : Yes (comment)(pt has hx of aggression with others) Thoughts of Harm to Others: No-Not Currently Present/Within Last 6 Months Current Homicidal Intent: No Current Homicidal Plan: No Access to Homicidal Means: No History of harm to others?: Yes Assessment of Violence: On admission Violent Behavior Description: pt states he has been aggressive in the past Does patient have access to weapons?: No Criminal Charges Pending?: No Does patient have a court date: No Is patient on probation?: No  Psychosis Hallucinations: Auditory(2 days ago, inconsistent) Delusions: None noted  Mental Status Report Appearance/Hygiene: Unremarkable, In scrubs Eye Contact: Good Motor Activity: Freedom of movement Speech: Logical/coherent Level of Consciousness: Alert Mood: Euthymic Affect: Appropriate to circumstance Anxiety Level: None Thought Processes: Coherent, Relevant Judgement: Partial Orientation: Place, Person, Time, Situation, Appropriate for developmental age Obsessive Compulsive Thoughts/Behaviors:  None  Cognitive Functioning Concentration: Normal Memory: Remote Intact, Recent Intact Is patient IDD: No Insight: Good Impulse Control: Fair Appetite: Good Have you had any weight changes? : No Change Sleep: No Change Total Hours of Sleep: 8 Vegetative Symptoms: None  ADLScreening [facility] Patient's cognitive ability adequate to safely complete daily activities?: Yes Patient able to express need for assistance with ADLs?: Yes Independently performs ADLs?: Yes (appropriate for developmental age)  Prior Inpatient Therapy Prior Inpatient Therapy: No  Prior Outpatient Therapy Prior Outpatient Therapy: Yes Prior Therapy Dates: ongoing Prior Therapy Facilty/Provider(s): Dr. Merlyn AlbertFred, MD with [facility] Reason for Treatment: Bipolar, med management Does patient have an ACCT team?: No Does patient have Intensive In-House Services?  : No Does patient have [facility] services? : Yes Does patient have P4CC services?: No  ADL Screening (condition at time of admission) Patient's cognitive ability adequate to safely complete daily activities?: Yes Is the patient deaf or have difficulty hearing?: No Does the patient have difficulty seeing, even when wearing glasses/contacts?: No Does the patient have difficulty concentrating, remembering, or making decisions?: No Patient able to express need for assistance with ADLs?: Yes Does the patient have difficulty dressing or bathing?: No Independently performs ADLs?: Yes (appropriate for developmental age) Does the patient have difficulty walking or climbing stairs?: No Weakness of Legs: None Weakness of Arms/Hands: None  Home Assistive Devices/Equipment Home Assistive Devices/Equipment: None    Abuse/Neglect Assessment (Assessment to be complete while patient is  alone) Abuse/Neglect Assessment Can Be Completed: Yes Physical Abuse: Denies Verbal Abuse: Denies Sexual Abuse: Denies Exploitation of patient/patient's resources:  Denies Self-Neglect: Denies     Merchant navy officerAdvance Directives (For Healthcare) Does Patient Have a Medical Advance Directive?: No Would patient like information on creating a medical advance directive?: No - Patient declined          Disposition: Per Malachy Chamberakia Starkes, NP pt does not meet criteria for inpt tx. Recommends pt f/u with OPT provider. [facility] expressed he is unhappy with his current OPT provider. Pt's nurse Campbell RichesLoudermilk, Susanna F, RN and EDP Margarita Grizzleay, Danielle, MD have been advised.  Disposition Initial Assessment Completed for this Encounter: Yes Disposition of Patient: Discharge Patient refused recommended treatment: No  This service was provided via telemedicine using a 2-way, interactive audio and video technology.  Names of all persons participating in this telemedicine service and their role in this encounter. Name: Wonda HornerBruce Massingale Role: Patient  Name: Princess Bruinsquicha Romey Mathieson Role: TTS          Karolee Ohsquicha R River Ambrosio 08/30/2018 8:18 PM

## 2018-08-30 NOTE — Progress Notes (Signed)
Per Priscille Loveless, NP pt does not meet criteria for inpt tx. Recommends pt f/u with OPT provider. TTS to send referrals to [facility] as pt expressed he is unhappy with his current OPT provider. Pt's nurse Jennings Books, RN and EDP Pattricia Boss, MD have been advised.  Resources to be faxed to [phone] as requested by RN.  Lind Covert, MSW, LCSW Therapeutic Triage Specialist  [phone]

## 2018-08-30 NOTE — ED Notes (Signed)
Pt wanded. No weapons found.

## 2018-08-30 NOTE — Discharge Instructions (Addendum)
Please take your medications as prescribed Call for follow up as per the resource list you are given Return if any problems

## 2018-09-12 DIAGNOSIS — Z79899 Other long term (current) drug therapy: Secondary | ICD-10-CM | POA: Diagnosis not present

## 2018-10-29 DIAGNOSIS — F6381 Intermittent explosive disorder: Secondary | ICD-10-CM | POA: Diagnosis not present

## 2018-10-29 DIAGNOSIS — F250 Schizoaffective disorder, bipolar type: Secondary | ICD-10-CM | POA: Diagnosis not present

## 2018-12-09 NOTE — Progress Notes (Signed)
[facility], [place] [address] Phone: [phone] Fax: [phone]  [id number] - [place], [address] AT [address] [place] [address] Phone: [phone] Fax: [phone]      Your procedure is scheduled on November 20  Report to [facility] Main Entrance "A" at 0530 A.M., and check in at the Admitting office.  Call this number if you have problems the morning of surgery:  [phone]  Call [phone] if you have any questions prior to your surgery date Monday-Friday 8am-4pm    Remember:  Do not eat or drink after midnight the night before your surgery     There are No medications that you need to take the morning of surgery  7 days prior to surgery STOP taking any Aspirin (unless otherwise instructed by your surgeon), Aleve, Naproxen, Ibuprofen, Motrin, Advil, Goody's, BC's, all herbal medications, fish oil, and all vitamins.    The Morning of Surgery  Do not wear jewelry  Do not wear lotions, powders, or colognes, or deodorant  Men may shave face and neck.  Do not bring valuables to the hospital.  [facility] is not responsible for any belongings or valuables.  If you are a smoker, DO NOT Smoke 24 hours prior to surgery IF you wear a CPAP at night please bring your mask, tubing, and machine the morning of surgery   Remember that you must have someone to transport you home after your surgery, and remain with you for 24 hours if you are discharged the same day.   Contacts, glasses, hearing aids, dentures or bridgework may not be worn into surgery.    Leave your suitcase in the car.  After surgery it may be brought to your room.  For patients admitted to the hospital, discharge time will be determined by your treatment team.  Patients discharged the day of surgery will not be allowed to  drive home.    Special instructions:   [facility]- Preparing For Surgery  Before surgery, you can play an important role. Because skin is not sterile, your skin needs to be as free of germs as possible. You can reduce the number of germs on your skin by washing with CHG (chlorahexidine gluconate) Soap before surgery.  CHG is an antiseptic cleaner which kills germs and bonds with the skin to continue killing germs even after washing.    Oral Hygiene is also important to reduce your risk of infection.  Remember - BRUSH YOUR TEETH THE MORNING OF SURGERY WITH YOUR REGULAR TOOTHPASTE  Please do not use if you have an allergy to CHG or antibacterial soaps. If your skin becomes reddened/irritated stop using the CHG.  Do not shave (including legs and underarms) for at least 48 hours prior to first CHG shower. It is OK to shave your face.  Please follow these instructions carefully.   1. Shower the NIGHT BEFORE SURGERY and the MORNING OF SURGERY with CHG Soap.   2. If you chose to wash your hair, wash your hair first as usual with your normal shampoo.  3. After you shampoo, rinse your hair and body thoroughly to remove the shampoo.  4. Use CHG as you would any other liquid soap. You can apply CHG directly to the skin and wash gently with a scrungie or a clean washcloth.   5. Apply the  CHG Soap to your body ONLY FROM THE NECK DOWN.  Do not use on open wounds or open sores. Avoid contact with your eyes, ears, mouth and genitals (private parts). Wash Face and genitals (private parts)  with your normal soap.   6. Wash thoroughly, paying special attention to the area where your surgery will be performed.  7. Thoroughly rinse your body with warm water from the neck down.  8. DO NOT shower/wash with your normal soap after using and rinsing off the CHG Soap.  9. Pat yourself dry with a CLEAN TOWEL.  10. Wear CLEAN PAJAMAS to bed the night before surgery, wear comfortable clothes the morning of  surgery  11. Place CLEAN SHEETS on your bed the night of your first shower and DO NOT SLEEP WITH PETS.    Day of Surgery:  Do not apply any deodorants/lotions. Please shower the morning of surgery with the CHG soap  Please wear clean clothes to the hospital/surgery center.   Remember to brush your teeth WITH YOUR REGULAR TOOTHPASTE.   Please read over the following fact sheets that you were given.

## 2018-12-10 ENCOUNTER — Inpatient Hospital Stay (HOSPITAL_COMMUNITY)
Admission: RE | Admit: 2018-12-10 | Discharge: 2018-12-10 | Disposition: A | Discharge status: Home / Self Care | Payer: Medicare HMO | Source: Ambulatory Visit

## 2018-12-10 ENCOUNTER — Other Ambulatory Visit (HOSPITAL_COMMUNITY)
Admission: RE | Admit: 2018-12-10 | Discharge: 2018-12-10 | Disposition: A | Discharge status: Home / Self Care | Payer: Medicare HMO | Source: Ambulatory Visit | Attending: Oral Surgery | Admitting: Oral Surgery

## 2018-12-10 DIAGNOSIS — Z20828 Contact with and (suspected) exposure to other viral communicable diseases: Secondary | ICD-10-CM | POA: Diagnosis not present

## 2018-12-10 DIAGNOSIS — Z01812 Encounter for preprocedural laboratory examination: Secondary | ICD-10-CM | POA: Insufficient documentation

## 2018-12-10 NOTE — Progress Notes (Signed)
Dr. Mee Hives office returned call stating we will proceed w/surgery as planned on 12/13/18.  Angelina Pih, CRNA - Director of Anesthesia Services and Short Stay Surgical, advised of issue - states we will proceed as if patient is COVID + for case on 12/13/18.  She will notify OR.  This RN notified Dr. Lupita Leash office.

## 2018-12-10 NOTE — Progress Notes (Signed)
Mr. Lezotte did not show for his PAT appointment scheduled for 1:00pm today.  Patient was called and he stated he went to [facility] for his COVID test; however, he was now on a public transit bus in [place].  He stated he could catch another bus and come to [facility].  Patient advised not to do that.  He is not self-quarantined if he is riding on a public bus.  Initial COVID test will not be accepted due to lack of self quarantine.  Dr. Lupita Leash office notified of above.  Will determine if Dr. Hoyt Koch plans to proceed on 12/13/18 or not.  Will follow-up prior to DOS.

## 2018-12-11 LAB — NOVEL CORONAVIRUS, NAA (HOSP ORDER, SEND-OUT TO REF LAB; TAT 18-24 HRS): SARS-CoV-2, NAA: NOT DETECTED

## 2018-12-12 ENCOUNTER — Encounter (HOSPITAL_COMMUNITY): Payer: Self-pay | Admitting: *Deleted

## 2018-12-12 ENCOUNTER — Other Ambulatory Visit: Payer: Self-pay

## 2018-12-12 NOTE — Progress Notes (Addendum)
Spoke with pt for pre-op call. Pt denies cardiac history, HTN or Diabetes. Pt had his Covid test done on Tuesday, 12/10/18, but rode the city bus home after getting the test done. Pt to be treated as a positive Covid pt per Lelon Perla, RN, Director

## 2018-12-13 ENCOUNTER — Ambulatory Visit (HOSPITAL_COMMUNITY): Payer: Medicare HMO | Admitting: Registered Nurse

## 2018-12-13 ENCOUNTER — Ambulatory Visit (HOSPITAL_COMMUNITY)
Admission: RE | Admit: 2018-12-13 | Discharge: 2018-12-13 | Disposition: A | Discharge status: Home / Self Care | Payer: Medicare HMO | Attending: Oral Surgery | Admitting: Oral Surgery

## 2018-12-13 ENCOUNTER — Encounter (HOSPITAL_COMMUNITY): Payer: Self-pay

## 2018-12-13 ENCOUNTER — Other Ambulatory Visit: Payer: Self-pay

## 2018-12-13 ENCOUNTER — Ambulatory Visit (HOSPITAL_COMMUNITY): Payer: Medicare HMO | Admitting: Vascular Surgery

## 2018-12-13 ENCOUNTER — Encounter (HOSPITAL_COMMUNITY)
Admission: RE | Disposition: A | Discharge status: Home / Self Care | Payer: Self-pay | Source: Home / Self Care | Attending: Oral Surgery

## 2018-12-13 DIAGNOSIS — K0530 Chronic periodontitis, unspecified: Secondary | ICD-10-CM | POA: Insufficient documentation

## 2018-12-13 DIAGNOSIS — F17200 Nicotine dependence, unspecified, uncomplicated: Secondary | ICD-10-CM | POA: Diagnosis not present

## 2018-12-13 DIAGNOSIS — F319 Bipolar disorder, unspecified: Secondary | ICD-10-CM | POA: Diagnosis not present

## 2018-12-13 DIAGNOSIS — K029 Dental caries, unspecified: Secondary | ICD-10-CM | POA: Insufficient documentation

## 2018-12-13 HISTORY — PX: MULTIPLE EXTRACTIONS WITH ALVEOLOPLASTY: SHX5342

## 2018-12-13 LAB — HEMOGLOBIN: Hemoglobin: 14 g/dL (ref 13.0–17.0)

## 2018-12-13 SURGERY — MULTIPLE EXTRACTION WITH ALVEOLOPLASTY
Anesthesia: General | Site: Mouth

## 2018-12-13 MED ORDER — LACTATED RINGERS IV SOLN
INTRAVENOUS | Status: DC
  Administered 2018-12-13: 10:00:00 via INTRAVENOUS

## 2018-12-13 MED ORDER — LIDOCAINE-EPINEPHRINE 2 %-1:100000 IJ SOLN
INTRAMUSCULAR | Status: AC
  Filled 2018-12-13: qty 1

## 2018-12-13 MED ORDER — FENTANYL CITRATE (PF) 250 MCG/5ML IJ SOLN
INTRAMUSCULAR | Status: AC
  Filled 2018-12-13: qty 5

## 2018-12-13 MED ORDER — 0.9 % SODIUM CHLORIDE (POUR BTL) OPTIME
TOPICAL | Status: DC | PRN
  Administered 2018-12-13: 1000 mL

## 2018-12-13 MED ORDER — SODIUM CHLORIDE 0.9 % IV SOLN
INTRAVENOUS | Status: AC | PRN
  Administered 2018-12-13: 1000 mL

## 2018-12-13 MED ORDER — ONDANSETRON HCL 4 MG/2ML IJ SOLN
INTRAMUSCULAR | Status: DC | PRN
  Administered 2018-12-13: 4 mg via INTRAVENOUS

## 2018-12-13 MED ORDER — LIDOCAINE 2% (20 MG/ML) 5 ML SYRINGE
INTRAMUSCULAR | Status: DC | PRN
  Administered 2018-12-13: 100 mg via INTRAVENOUS

## 2018-12-13 MED ORDER — DEXAMETHASONE SODIUM PHOSPHATE 10 MG/ML IJ SOLN
INTRAMUSCULAR | Status: DC | PRN
  Administered 2018-12-13: 10 mg via INTRAVENOUS

## 2018-12-13 MED ORDER — SUCCINYLCHOLINE CHLORIDE 20 MG/ML IJ SOLN
INTRAMUSCULAR | Status: DC | PRN
  Administered 2018-12-13: 100 mg via INTRAVENOUS

## 2018-12-13 MED ORDER — PROPOFOL 10 MG/ML IV BOLUS
INTRAVENOUS | Status: AC
  Filled 2018-12-13: qty 20

## 2018-12-13 MED ORDER — ONDANSETRON HCL 4 MG/2ML IJ SOLN
INTRAMUSCULAR | Status: AC
  Filled 2018-12-13: qty 2

## 2018-12-13 MED ORDER — AMOXICILLIN 500 MG PO CAPS: 500.0000 mg | ORAL_CAPSULE | Freq: Three times a day (TID) | ORAL | 0 refills | Status: DC

## 2018-12-13 MED ORDER — DEXMEDETOMIDINE HCL 200 MCG/2ML IV SOLN
INTRAVENOUS | Status: DC | PRN
  Administered 2018-12-13: 8 ug via INTRAVENOUS

## 2018-12-13 MED ORDER — MIDAZOLAM HCL 2 MG/2ML IJ SOLN
INTRAMUSCULAR | Status: AC
  Filled 2018-12-13: qty 2

## 2018-12-13 MED ORDER — CEFAZOLIN SODIUM-DEXTROSE 2-4 GM/100ML-% IV SOLN
INTRAVENOUS | Status: AC
  Filled 2018-12-13: qty 100

## 2018-12-13 MED ORDER — DEXAMETHASONE SODIUM PHOSPHATE 10 MG/ML IJ SOLN
INTRAMUSCULAR | Status: AC
  Filled 2018-12-13: qty 1

## 2018-12-13 MED ORDER — SUCCINYLCHOLINE CHLORIDE 200 MG/10ML IV SOSY
PREFILLED_SYRINGE | INTRAVENOUS | Status: AC
  Filled 2018-12-13: qty 10

## 2018-12-13 MED ORDER — LIDOCAINE 2% (20 MG/ML) 5 ML SYRINGE
INTRAMUSCULAR | Status: AC
  Filled 2018-12-13: qty 5

## 2018-12-13 MED ORDER — MIDAZOLAM HCL 5 MG/5ML IJ SOLN
INTRAMUSCULAR | Status: DC | PRN
  Administered 2018-12-13: 2 mg via INTRAVENOUS

## 2018-12-13 MED ORDER — OXYCODONE-ACETAMINOPHEN 5-325 MG PO TABS: 1.0000 | ORAL_TABLET | ORAL | 0 refills | Status: DC | PRN

## 2018-12-13 MED ORDER — PROPOFOL 10 MG/ML IV BOLUS
INTRAVENOUS | Status: DC | PRN
  Administered 2018-12-13: 200 mg via INTRAVENOUS

## 2018-12-13 MED ORDER — CEFAZOLIN SODIUM-DEXTROSE 2-3 GM-%(50ML) IV SOLR
INTRAVENOUS | Status: DC | PRN
  Administered 2018-12-13: 2 g via INTRAVENOUS

## 2018-12-13 MED ORDER — FENTANYL CITRATE (PF) 100 MCG/2ML IJ SOLN
INTRAMUSCULAR | Status: DC | PRN
  Administered 2018-12-13: 100 ug via INTRAVENOUS
  Administered 2018-12-13: 50 ug via INTRAVENOUS

## 2018-12-13 MED ORDER — OXYMETAZOLINE HCL 0.05 % NA SOLN
NASAL | Status: DC | PRN
  Administered 2018-12-13: 2 via NASAL

## 2018-12-13 SURGICAL SUPPLY — 40 items
BLADE 15 SAFETY STRL DISP (BLADE) ×1 IMPLANT
BUR CROSS CUT FISSURE 1.6 (BURR) ×2 IMPLANT
BUR EGG ELITE 4.0 (BURR) ×2 IMPLANT
CANISTER SUCT 3000ML PPV (MISCELLANEOUS) ×2 IMPLANT
COVER SURGICAL LIGHT HANDLE (MISCELLANEOUS) ×1 IMPLANT
COVER WAND RF STERILE (DRAPES) ×1 IMPLANT
DECANTER SPIKE VIAL GLASS SM (MISCELLANEOUS) ×1 IMPLANT
DRAPE U-SHAPE 76X120 STRL (DRAPES) ×2 IMPLANT
GAUZE PACKING FOLDED 2  STR (GAUZE/BANDAGES/DRESSINGS) ×1
GAUZE PACKING FOLDED 2 STR (GAUZE/BANDAGES/DRESSINGS) ×1 IMPLANT
GLOVE BIO SURGEON STRL SZ 6.5 (GLOVE) IMPLANT
GLOVE BIO SURGEON STRL SZ7.5 (GLOVE) ×2 IMPLANT
GLOVE BIOGEL PI IND STRL 6.5 (GLOVE) IMPLANT
GLOVE BIOGEL PI IND STRL 7.0 (GLOVE) IMPLANT
GLOVE BIOGEL PI INDICATOR 6.5 (GLOVE)
GLOVE BIOGEL PI INDICATOR 7.0 (GLOVE)
GLOVE SURG SS PI 7.5 STRL IVOR (GLOVE) ×2 IMPLANT
GOWN STRL REUS W/ TWL LRG LVL3 (GOWN DISPOSABLE) ×1 IMPLANT
GOWN STRL REUS W/ TWL XL LVL3 (GOWN DISPOSABLE) ×1 IMPLANT
GOWN STRL REUS W/TWL LRG LVL3 (GOWN DISPOSABLE) ×1
GOWN STRL REUS W/TWL XL LVL3 (GOWN DISPOSABLE) ×1
IV NS 1000ML (IV SOLUTION) ×1
IV NS 1000ML BAXH (IV SOLUTION) ×1 IMPLANT
KIT BASIN OR (CUSTOM PROCEDURE TRAY) ×2 IMPLANT
KIT TURNOVER KIT B (KITS) ×2 IMPLANT
MARKER SKIN DUAL TIP RULER LAB (MISCELLANEOUS) ×1 IMPLANT
NDL HYPO 25GX1X1/2 BEV (NEEDLE) ×2 IMPLANT
NDL PRECISIONGLIDE 27X1.5 (NEEDLE) IMPLANT
NEEDLE HYPO 25GX1X1/2 BEV (NEEDLE) ×2 IMPLANT
NEEDLE PRECISIONGLIDE 27X1.5 (NEEDLE) IMPLANT
NS IRRIG 1000ML POUR BTL (IV SOLUTION) ×2 IMPLANT
PAD ARMBOARD 7.5X6 YLW CONV (MISCELLANEOUS) ×4 IMPLANT
POSITIONER HEAD DONUT 9IN (MISCELLANEOUS) ×2 IMPLANT
SLEEVE IRRIGATION ELITE 7 (MISCELLANEOUS) ×2 IMPLANT
SPONGE SURGIFOAM ABS GEL 12-7 (HEMOSTASIS) IMPLANT
SUT CHROMIC 3 0 PS 2 (SUTURE) ×2 IMPLANT
SYR CONTROL 10ML LL (SYRINGE) ×2 IMPLANT
TRAY ENT MC OR (CUSTOM PROCEDURE TRAY) ×2 IMPLANT
TUBING IRRIGATION (MISCELLANEOUS) ×2 IMPLANT
YANKAUER SUCT BULB TIP NO VENT (SUCTIONS) ×2 IMPLANT

## 2018-12-13 NOTE — H&P (Signed)
HISTORY AND PHYSICAL  Randall [name] is a 43 y.o. male patient with CC: painful teeth  No diagnosis found.  Past Medical History:  Diagnosis Date  . Bipolar 1 disorder ([place])    under control  . History of kidney stones   . Sinus drainage    seasonal-spring    Current Facility-Administered Medications  Medication Dose Route Frequency Provider Last Rate Last Dose  . ceFAZolin (ANCEF) 2-4 GM/100ML-% IVPB           . lactated ringers infusion   Intravenous Continuous Lidia Collum, MD 10 mL/hr at 12/13/18 [phone]     No Known Allergies Active Problems:   * No active hospital problems. *  Vitals: Blood pressure 115/75, pulse 66, temperature 98.5 F (36.9 C), temperature source Oral, resp. rate 16, height 6\' 1"  (1.854 m), weight 91.2 kg, SpO2 95 %. Lab results: Results for orders placed or performed during the hospital encounter of 12/13/18 (from the past 24 hour(s))  Hemoglobin     Status: None   Collection Time: 12/13/18 11:28 AM  Result Value Ref Range   Hemoglobin 14.0 13.0 - 17.0 g/dL   Radiology Results: No results found. General appearance: alert, cooperative and mild distress Head: Normocephalic, without obvious abnormality, atraumatic Eyes: negative Nose: Nares normal. Septum midline. Mucosa normal. No drainage or sinus tenderness. Throat: gross decay all remaining teeth. Mild gingival edema, no fluctuance or purulence. Pharynx clear. Neck: no adenopathy and supple, symmetrical, trachea midline Resp: clear to auscultation bilaterally Cardio: regular rate and rhythm, S1, S2 normal, no murmur, click, rub or gallop  Assessment: All teeth non-restorable secondary to dental caries and chronic severe periodontitis.  Plan: Full mouth extractions, alveoloplasty, GA, Day surgery.   Diona Browner 12/13/2018

## 2018-12-13 NOTE — Anesthesia Procedure Notes (Signed)
Procedure Name: Intubation Date/Time: 12/13/2018 1:37 PM Performed by: Trinna Post., CRNA Pre-anesthesia Checklist: Patient identified, Emergency Drugs available, Suction available, Patient being monitored and Timeout performed Patient Re-evaluated:Patient Re-evaluated prior to induction Oxygen Delivery Method: Circle system utilized Preoxygenation: Pre-oxygenation with 100% oxygen Induction Type: IV induction and Rapid sequence Laryngoscope Size: Mac and 4 Grade View: Grade I Nasal Tubes: Left, Nasal prep performed, Nasal Rae and Magill forceps- large, utilized Tube size: 7.0 mm Number of attempts: 1 Placement Confirmation: ETT inserted through vocal cords under direct vision,  positive ETCO2 and breath sounds checked- equal and bilateral Tube secured with: Tape Dental Injury: Teeth and Oropharynx as per pre-operative assessment

## 2018-12-13 NOTE — Anesthesia Preprocedure Evaluation (Addendum)
Anesthesia Evaluation  Patient identified by MRN, date of birth, ID band Patient awake    Reviewed: Allergy & Precautions, NPO status , Patient's Chart, lab work & pertinent test results  History of Anesthesia Complications Negative for: history of anesthetic complications  Airway Mallampati: II  TM Distance: >3 FB Neck ROM: Full    Dental  (+) Poor Dentition   Pulmonary Current Smoker,    Pulmonary exam normal        Cardiovascular negative cardio ROS Normal cardiovascular exam     Neuro/Psych PSYCHIATRIC DISORDERS Bipolar Disorder negative neurological ROS     GI/Hepatic negative GI ROS, Neg liver ROS,   Endo/Other  negative endocrine ROS  Renal/GU Renal disease (kidney stones)  negative genitourinary   Musculoskeletal negative musculoskeletal ROS (+)   Abdominal   Peds  Hematology negative hematology ROS (+)   Anesthesia Other Findings   Reproductive/Obstetrics                            Anesthesia Physical Anesthesia Plan  ASA: II  Anesthesia Plan: General   Post-op Pain Management:    Induction: Intravenous  PONV Risk Score and Plan: 1 and Ondansetron, Dexamethasone, Treatment may vary due to age or medical condition and Midazolam  Airway Management Planned: Nasal ETT  Additional Equipment: None  Intra-op Plan:   Post-operative Plan: Extubation in OR  Informed Consent: I have reviewed the patients History and Physical, chart, labs and discussed the procedure including the risks, benefits and alternatives for the proposed anesthesia with the patient or authorized representative who has indicated his/her understanding and acceptance.     Dental advisory given  Plan Discussed with:   Anesthesia Plan Comments:        Anesthesia Quick Evaluation

## 2018-12-13 NOTE — Transfer of Care (Signed)
Immediate Anesthesia Transfer of Care Note  Patient: Randall [name]  Procedure(s) Performed: MULTIPLE EXTRACTION WITH ALVEOLOPLASTY (N/A Mouth)  Patient Location: PACU and PACU RN reported to OR 19 to recover and discharge patient, due to Covid status  Anesthesia Type:General  Level of Consciousness: awake, alert  and oriented  Airway & Oxygen Therapy: Patient Spontanous Breathing and Patient connected to face mask oxygen  Post-op Assessment: Report given to RN and Post -op Vital signs reviewed and stable  Post vital signs: Reviewed and stable  Last Vitals:  Vitals Value Taken Time  BP    Temp    Pulse 78 12/13/18 1504  Resp    SpO2 94 % 12/13/18 1504  Vitals shown include unvalidated device data.  Last Pain:  Vitals:   12/13/18 1500  TempSrc:   PainSc: (P) 0-No pain         Complications: No apparent anesthesia complications

## 2018-12-13 NOTE — Op Note (Signed)
NAMESHADOE, BETHEL MEDICAL RECORD WE:[id number] ACCOUNT [id number] DATE OF BIRTH:Jul 26, 1975 FACILITY: [facility] LOCATION: [facility] PHYSICIAN:Meagan Ancona M. Martyna Thorns, DDS  OPERATIVE REPORT  DATE OF PROCEDURE:  12/13/2018  PREOPERATIVE DIAGNOSIS:  Nonrestorable teeth secondary to dental caries and periodontitis numbers 3, 4, 6, 7, 8, 9, 11, 12, 13, 14, 18, 20, 21, 22, 23, 24, 25, 26, 27, 28, 29, 30.  POSTOPERATIVE DIAGNOSIS:  Nonrestorable teeth secondary to dental caries and periodontitis numbers 3, 4, 6, 7, 8, 9, 11, 12, 13, 14, 18, 20, 21, 22, 23, 24, 25, 26, 27, 28, 29, 30.  PROCEDURE:  Extraction of teeth numbered above, alveoplasty right and left maxilla and mandible.  SURGEON:  Diona Browner, DDS  ANESTHESIA:  General, nasal intubation, Dr. Ambrose Pancoast anesthesia.  DESCRIPTION OF PROCEDURE:  The patient was taken to the operating room and placed on the table in supine position.  General anesthesia was administered intravenously and a nasal endotracheal tube was placed and secured.  The eyes were protected and the  patient was draped for surgery.  A timeout was performed.  The posterior pharynx was suctioned and a throat pack was placed, 2% lidocaine with 1:100,000 epinephrine was infiltrated in an inferior alveolar block technique on the right and left side and in  buccal and palatal infiltration around the teeth in the maxilla.  A bite block was placed on the right side of the mouth and a sweetheart retractor was used to retract the tongue.  A 15 blade was used to make an incision beginning at tooth 18 carried  forward in the gingival sulcus both buccally and lingually until tooth 27 was encountered and then in the maxilla the 15 blade was used to make an incision beginning at tooth 14 and carried forward in the gingival sulcus buccally and palatally until  tooth 6 was encountered.  The periosteum was removed from around these teeth.  Bone was removed around tooth 18 and then the tooth was elevated and  removed with a periosteal elevator and a 301 elevator.  Then, the 301 elevator was used to elevate teeth  numbers 20, 21, 22, 23, 24, 25, 26.  The teeth were removed with the dental forceps.  Then, in the maxilla the 301 elevator was used to elevate the upper teeth and teeth numbers 14, 13, 12, 10, 9, 8, 7 were removed with the upper forceps.  Teeth numbers  11 and 6 required removal of additional bone with a [employer] handpiece under irrigation and then the teeth were removed with the forceps.  Then, the sockets of the left maxilla and mandible were curetted.  The periosteum was reflected to expose the  alveolar crest and alveoplasty was performed using the rongeurs and the egg-shaped bur and bone file.  Then, the areas were irrigated and closed with 3-0 chromic.  The bite block and sweetheart retractor were repositioned to the other side of the mouth  and a 15 blade was used to make an incision around teeth numbers 3, 4, 5 in the maxilla buccally and palatally and around teeth numbers 27, 28, 29 and 30 in the mandible buccally and lingually.  The periosteum was reflected.  The teeth were elevated and  removed with the dental forceps.  Then, the tissue was reflected to expose the irregular contour of the bone of the alveolar ridge and alveoplasty was performed using the egg bur and bone file.  Then, the areas were irrigated and closed with 3-0 chromic.   Then, the oral cavity was irrigated and  suctioned and a throat pack was removed.  The patient was left in care of anesthesia for extubation and transport to recovery room with plans for discharge home through day surgery.  ESTIMATED BLOOD LOSS:  Minimal.  COMPLICATIONS:  None.  SPECIMENS:  None.  TN/NUANCE  D:12/13/2018 T:12/13/2018 JOB:[id number]

## 2018-12-13 NOTE — Op Note (Signed)
12/13/2018  2:28 PM  PATIENT:  Clearnce Hasten  43 y.o. male  PRE-OPERATIVE DIAGNOSIS:  NON RESTORABLE TEETH # 3, 4, 6, 7, 8, 9, 11, 12, 13, 14, 18, 20, 21, 22, 23,24, 25, 26, 27, 28, 29, 30  POST-OPERATIVE DIAGNOSIS:  SAME  PROCEDURE:  Procedure(s): MULTIPLE EXTRACTION TEETH # 3, 4, 6, 7, 8, 9, 11, 12, 13, 14, 18, 20, 21, 22, 23,24, 25, 26, 27, 28, 29, 30  WITH ALVEOLOPLASTY  SURGEON:  Surgeon(s): Diona Browner, DDS  ANESTHESIA:   local and general  EBL:  minimal  DRAINS: none   SPECIMEN:  No Specimen  COUNTS:  YES  PLAN OF CARE: Discharge to home after PACU  PATIENT DISPOSITION:  PACU - hemodynamically stable.   PROCEDURE DETAILS: Dictation # [id number]  Gae Bon, DMD 12/13/2018 2:28 PM

## 2018-12-15 NOTE — Anesthesia Postprocedure Evaluation (Signed)
Anesthesia Post Note  Patient: Randall [name]  Procedure(s) Performed: MULTIPLE EXTRACTION WITH ALVEOLOPLASTY (N/A Mouth)     Patient location during evaluation: Other Anesthesia Type: General Level of consciousness: sedated and patient cooperative Pain management: pain level controlled Vital Signs Assessment: post-procedure vital signs reviewed and stable Respiratory status: spontaneous breathing Cardiovascular status: stable Anesthetic complications: no Comments: Recovered in OR    Last Vitals:  Vitals:   12/13/18 1529 12/13/18 1530  BP: (!) 130/91   Pulse: 71 71  Resp:    Temp:  (!) 36.1 C  SpO2: 93% 92%    Last Pain:  Vitals:   12/13/18 1530  TempSrc:   PainSc: 0-No pain                 Nolon Nations

## 2018-12-16 ENCOUNTER — Encounter (HOSPITAL_COMMUNITY): Payer: Self-pay | Admitting: Oral Surgery

## 2019-01-09 DIAGNOSIS — F419 Anxiety disorder, unspecified: Secondary | ICD-10-CM | POA: Diagnosis not present

## 2019-01-09 DIAGNOSIS — Z79899 Other long term (current) drug therapy: Secondary | ICD-10-CM | POA: Diagnosis not present

## 2019-01-09 DIAGNOSIS — F329 Major depressive disorder, single episode, unspecified: Secondary | ICD-10-CM | POA: Diagnosis not present

## 2019-01-09 DIAGNOSIS — F209 Schizophrenia, unspecified: Secondary | ICD-10-CM | POA: Diagnosis not present

## 2019-03-29 ENCOUNTER — Ambulatory Visit (HOSPITAL_COMMUNITY)
Admission: EM | Admit: 2019-03-29 | Discharge: 2019-03-29 | Disposition: A | Discharge status: Home / Self Care | Payer: Medicare HMO | Attending: Emergency Medicine | Admitting: Emergency Medicine

## 2019-03-29 ENCOUNTER — Encounter (HOSPITAL_COMMUNITY): Payer: Self-pay

## 2019-03-29 ENCOUNTER — Other Ambulatory Visit: Payer: Self-pay

## 2019-03-29 DIAGNOSIS — Z79899 Other long term (current) drug therapy: Secondary | ICD-10-CM | POA: Diagnosis not present

## 2019-03-29 DIAGNOSIS — F17210 Nicotine dependence, cigarettes, uncomplicated: Secondary | ICD-10-CM | POA: Insufficient documentation

## 2019-03-29 DIAGNOSIS — Z20822 Contact with and (suspected) exposure to covid-19: Secondary | ICD-10-CM | POA: Diagnosis not present

## 2019-03-29 DIAGNOSIS — J302 Other seasonal allergic rhinitis: Secondary | ICD-10-CM | POA: Diagnosis not present

## 2019-03-29 DIAGNOSIS — Z87442 Personal history of urinary calculi: Secondary | ICD-10-CM | POA: Insufficient documentation

## 2019-03-29 DIAGNOSIS — F319 Bipolar disorder, unspecified: Secondary | ICD-10-CM | POA: Diagnosis not present

## 2019-03-29 DIAGNOSIS — Z8616 Personal history of COVID-19: Secondary | ICD-10-CM | POA: Diagnosis not present

## 2019-03-29 MED ORDER — PREDNISONE 20 MG PO TABS: 20.0000 mg | ORAL_TABLET | Freq: Two times a day (BID) | ORAL | 0 refills | Status: AC

## 2019-03-29 MED ORDER — CETIRIZINE HCL 10 MG PO CAPS: 10.0000 mg | ORAL_CAPSULE | Freq: Every day | ORAL | 0 refills | Status: DC

## 2019-03-29 MED ORDER — FLUTICASONE PROPIONATE 50 MCG/ACT NA SUSP: 1.0000 | Freq: Every day | NASAL | 0 refills | Status: DC

## 2019-03-29 NOTE — Discharge Instructions (Signed)
COVID swab pending Begin daily cetirizine/Zyrtec or Claritin, may get over-the-counter if this is cheaper for you to help with congestion/drainage/itching Flonase nasal spray 1 to 2 spray in each nostril daily Prednisone 20 mg twice daily with food for the next 4 days to further help with any sinus inflammation  Your left ear had wax buildup-try over-the-counter DEBROX to help with breaking down wax buildup, use instructions on box  Please follow-up if any symptoms not improving, worsening, developing fevers, increased sinus pressure pain

## 2019-03-29 NOTE — ED Provider Notes (Signed)
[facility]    CSN: [id number] Arrival date & time: 03/29/19  1203      History   Chief Complaint Chief Complaint  Patient presents with  . Sinus Problem    HPI Randall [name] is a 44 y.o. male history of bipolar type I, presenting today for evaluation of sinus congestion, rhinorrhea and itchy ears.  Patient states that over the past week he has had some sinus congestion and feeling itchy.  Feels similar to prior allergy symptoms which he experiences around seasonal changes.  He has used Benadryl without relief.  Denies any fevers chills or body aches.  Denies any Covid exposure.  Patient did have Covid in November, but is out of his 37-month timeframe from illness.  Denies any chest pain, shortness of breath or cough.  Denies any nausea vomiting or diarrhea.  Maintaining normal appetite, energy level, normal taste and smell.  HPI  Past Medical History:  Diagnosis Date  . Bipolar 1 disorder ([place])    under control  . History of kidney stones   . Sinus drainage    seasonal-spring    Patient Active Problem List   Diagnosis Date Noted  . Nephrolithiasis 12/23/2013    Past Surgical History:  Procedure Laterality Date  . CIRCUMCISION  age 16  . MULTIPLE EXTRACTIONS WITH ALVEOLOPLASTY N/A 12/13/2018   Procedure: MULTIPLE EXTRACTION WITH ALVEOLOPLASTY;  Surgeon: Diona Browner, DDS;  Location: [facility];  Service: Oral Surgery;  Laterality: N/A;  . NEPHROLITHOTOMY Right 12/23/2013   Procedure: RIGHT PERCUTANEOUS NEPHROLITHOTOMY WITH SURGEON ACCESS;  Surgeon: Ardis Hughs, MD;  Location: [facility];  Service: Urology;  Laterality: Right;       Home Medications    Prior to Admission medications   Medication Sig Start Date End Date Taking? Authorizing Provider  amoxicillin (AMOXIL) 500 MG capsule Take 1 capsule (500 mg total) by mouth 3 (three) times daily. 12/13/18   Diona Browner, DDS  Cetirizine HCl 10 MG CAPS Take 1 capsule (10 mg total) by mouth daily. 03/29/19   Brittnei Jagiello,  Eugene Isadore C, PA-C  divalproex (DEPAKOTE) 500 MG DR tablet Take 500-1,000 mg by mouth See admin instructions. Take 500 mg by mouth every morning and 1000 mg at night    [provider]  fluticasone (FLONASE) 50 MCG/ACT nasal spray Place 1-2 sprays into both nostrils daily for 7 days. 03/29/19 04/05/19  Kijana Estock C, PA-C  oxyCODONE-acetaminophen (PERCOCET) 5-325 MG tablet Take 1 tablet by mouth every 4 (four) hours as needed. 12/13/18   Diona Browner, DDS  predniSONE (DELTASONE) 20 MG tablet Take 1 tablet (20 mg total) by mouth 2 (two) times daily with a meal for 4 days. 03/29/19 04/02/19  Shaunda Tipping C, PA-C  risperiDONE (RISPERDAL) 1 MG tablet Take 1 mg by mouth at bedtime.    [provider]    Family History History reviewed. No pertinent family history.  Social History Social History   Tobacco Use  . Smoking status: Current Every Day Smoker    Packs/day: 1.00    Types: Cigarettes  . Smokeless tobacco: Never Used  Substance Use Topics  . Alcohol use: Not Currently    Comment: occ  . Drug use: No     Allergies   Patient has no known allergies.   Review of Systems Review of Systems  Constitutional: Negative for activity change, appetite change, chills, fatigue and fever.  HENT: Positive for congestion, rhinorrhea and sinus pressure. Negative for ear pain, sore throat and trouble swallowing.  Eyes: Negative for discharge and redness.  Respiratory: Negative for cough, chest tightness and shortness of breath.   Cardiovascular: Negative for chest pain.  Gastrointestinal: Negative for abdominal pain, diarrhea, nausea and vomiting.  Musculoskeletal: Negative for myalgias.  Skin: Negative for rash.  Neurological: Negative for dizziness, light-headedness and headaches.     Physical Exam Triage Vital Signs ED Triage Vitals  Enc Vitals Group     BP 03/29/19 1253 127/75     Pulse Rate 03/29/19 1253 72     Resp 03/29/19 1253 16     Temp 03/29/19 1253 98.6 F  (37 C)     Temp Source 03/29/19 1253 Oral     SpO2 03/29/19 1253 100 %     Weight --      Height --      Head Circumference --      Peak Flow --      Pain Score 03/29/19 1259 7     Pain Loc --      Pain Edu? --      Excl. in GC? --    No data found.  Updated Vital Signs BP 127/75 (BP Location: Left Arm)   Pulse 72   Temp 98.6 F (37 C) (Oral)   Resp 16   SpO2 100%   Visual Acuity Right Eye Distance:   Left Eye Distance:   Bilateral Distance:    Right Eye Near:   Left Eye Near:    Bilateral Near:     Physical Exam Vitals and nursing note reviewed.  Constitutional:      Appearance: He is well-developed.  HENT:     Head: Normocephalic and atraumatic.     Ears:     Comments: Left cerumen impaction, TM not visualized  Right TM without erythema, normal bony landmarks    Nose:     Comments: Nasal mucosa erythematous with swollen turbinates Eyes:     Conjunctiva/sclera: Conjunctivae normal.  Cardiovascular:     Rate and Rhythm: Normal rate and regular rhythm.     Heart sounds: No murmur.  Pulmonary:     Effort: Pulmonary effort is normal. No respiratory distress.     Breath sounds: Normal breath sounds.     Comments: Breathing comfortably at rest, CTABL, no wheezing, rales or other adventitious sounds auscultated Abdominal:     Palpations: Abdomen is soft.     Tenderness: There is no abdominal tenderness.  Musculoskeletal:     Cervical back: Neck supple.  Skin:    General: Skin is warm and dry.  Neurological:     Mental Status: He is alert.      UC Treatments / Results  Labs (all labs ordered are listed, but only abnormal results are displayed) Labs Reviewed  NOVEL CORONAVIRUS, NAA (HOSP ORDER, SEND-OUT TO REF LAB; TAT 18-24 HRS)    EKG   Radiology No results found.  Procedures Procedures (including critical care time)  Medications Ordered in UC Medications - No data to display  Initial Impression / Assessment and Plan / UC Course  I have  reviewed the triage vital signs and the nursing notes.  Pertinent labs & imaging results that were available during my care of the patient were reviewed by me and considered in my medical decision making (see chart for details).     Covid PCR pending to rule out Covid.  Will go out and treat for allergic rhinitis with Flonase, daily antihistamine and short course of prednisone.  Continue hydration and fluids.  Advised patient to follow-up if developing fevers, symptoms not improving with the above or increase sinus pressure/pain this may need need antibiotics if not improving.  Discussed strict return precautions. Patient verbalized understanding and is agreeable with plan.  Final Clinical Impressions(s) / UC Diagnoses   Final diagnoses:  Seasonal allergic rhinitis, unspecified trigger     Discharge Instructions     COVID swab pending Begin daily cetirizine/Zyrtec or Claritin, may get over-the-counter if this is cheaper for you to help with congestion/drainage/itching Flonase nasal spray 1 to 2 spray in each nostril daily Prednisone 20 mg twice daily with food for the next 4 days to further help with any sinus inflammation  Your left ear had wax buildup-try over-the-counter DEBROX to help with breaking down wax buildup, use instructions on box  Please follow-up if any symptoms not improving, worsening, developing fevers, increased sinus pressure pain   ED Prescriptions    Medication Sig Dispense Auth. Provider   Cetirizine HCl 10 MG CAPS Take 1 capsule (10 mg total) by mouth daily. 30 capsule Jaidon Ellery C, PA-C   fluticasone (FLONASE) 50 MCG/ACT nasal spray Place 1-2 sprays into both nostrils daily for 7 days. 1 g Mahealani Sulak C, PA-C   predniSONE (DELTASONE) 20 MG tablet Take 1 tablet (20 mg total) by mouth 2 (two) times daily with a meal for 4 days. 8 tablet Carri Spillers, [place] C, PA-C     PDMP not reviewed this encounter.   Gricel Copen, [place] C, PA-C 03/29/19 1330

## 2019-03-29 NOTE — ED Triage Notes (Signed)
Pt present facial pain and swelling with ears clogged. Symptom started over a week ago. Pt has tried otc medication with no relief.

## 2019-03-30 LAB — NOVEL CORONAVIRUS, NAA (HOSP ORDER, SEND-OUT TO REF LAB; TAT 18-24 HRS): SARS-CoV-2, NAA: NOT DETECTED

## 2019-05-27 ENCOUNTER — Encounter (HOSPITAL_COMMUNITY): Payer: Self-pay

## 2019-05-27 ENCOUNTER — Emergency Department (HOSPITAL_COMMUNITY)
Admission: EM | Admit: 2019-05-27 | Discharge: 2019-05-28 | Disposition: A | Discharge status: Home / Self Care | Payer: Medicare HMO | Attending: Emergency Medicine | Admitting: Emergency Medicine

## 2019-05-27 ENCOUNTER — Other Ambulatory Visit: Payer: Self-pay

## 2019-05-27 DIAGNOSIS — R45851 Suicidal ideations: Secondary | ICD-10-CM | POA: Diagnosis not present

## 2019-05-27 DIAGNOSIS — Z20822 Contact with and (suspected) exposure to covid-19: Secondary | ICD-10-CM | POA: Insufficient documentation

## 2019-05-27 DIAGNOSIS — R440 Auditory hallucinations: Secondary | ICD-10-CM | POA: Diagnosis present

## 2019-05-27 DIAGNOSIS — F419 Anxiety disorder, unspecified: Secondary | ICD-10-CM | POA: Diagnosis not present

## 2019-05-27 DIAGNOSIS — R45850 Homicidal ideations: Secondary | ICD-10-CM | POA: Diagnosis not present

## 2019-05-27 DIAGNOSIS — R441 Visual hallucinations: Secondary | ICD-10-CM | POA: Diagnosis not present

## 2019-05-27 DIAGNOSIS — Z79899 Other long term (current) drug therapy: Secondary | ICD-10-CM | POA: Diagnosis not present

## 2019-05-27 DIAGNOSIS — F259 Schizoaffective disorder, unspecified: Secondary | ICD-10-CM | POA: Insufficient documentation

## 2019-05-27 DIAGNOSIS — F319 Bipolar disorder, unspecified: Secondary | ICD-10-CM | POA: Insufficient documentation

## 2019-05-27 DIAGNOSIS — R443 Hallucinations, unspecified: Secondary | ICD-10-CM

## 2019-05-27 DIAGNOSIS — Z03818 Encounter for observation for suspected exposure to other biological agents ruled out: Secondary | ICD-10-CM | POA: Diagnosis not present

## 2019-05-27 LAB — VALPROIC ACID LEVEL: Valproic Acid Lvl: <10 ug/mL — ABNORMAL LOW (ref 50.0–100.0)

## 2019-05-27 LAB — CBC
HCT: 46.2 % (ref 39.0–52.0)
Hemoglobin: 14.9 g/dL (ref 13.0–17.0)
MCH: 30.4 pg (ref 26.0–34.0)
MCHC: 32.3 g/dL (ref 30.0–36.0)
MCV: 94.3 fL (ref 80.0–100.0)
Platelets: 263 10*3/uL (ref 150–400)
RBC: 4.9 MIL/uL (ref 4.22–5.81)
RDW: 13.5 % (ref 11.5–15.5)
WBC: 8.9 10*3/uL (ref 4.0–10.5)
nRBC: 0 % (ref 0.0–0.2)

## 2019-05-27 LAB — COMPREHENSIVE METABOLIC PANEL
ALT: 34 U/L (ref 0–44)
AST: 59 U/L — ABNORMAL HIGH (ref 15–41)
Albumin: 4.1 g/dL (ref 3.5–5.0)
Alkaline Phosphatase: 59 U/L (ref 38–126)
Anion gap: 9 (ref 5–15)
BUN: 12 mg/dL (ref 6–20)
CO2: 27 mmol/L (ref 22–32)
Calcium: 9.4 mg/dL (ref 8.9–10.3)
Chloride: 104 mmol/L (ref 98–111)
Creatinine, Ser: 1.09 mg/dL (ref 0.61–1.24)
GFR calc Af Amer: >60 mL/min (ref >60)
GFR calc non Af Amer: >60 mL/min (ref >60)
Glucose, Bld: 108 mg/dL — ABNORMAL HIGH (ref 70–99)
Potassium: 4.1 mmol/L (ref 3.5–5.1)
Sodium: 140 mmol/L (ref 135–145)
Total Bilirubin: 0.4 mg/dL (ref 0.3–1.2)
Total Protein: 7.3 g/dL (ref 6.5–8.1)

## 2019-05-27 LAB — SALICYLATE LEVEL: Salicylate Lvl: <7 mg/dL — ABNORMAL LOW (ref 7.0–30.0)

## 2019-05-27 LAB — RAPID URINE DRUG SCREEN, HOSP PERFORMED
Amphetamines: NOT DETECTED
Barbiturates: NOT DETECTED
Benzodiazepines: NOT DETECTED
Cocaine: NOT DETECTED
Opiates: NOT DETECTED
Tetrahydrocannabinol: NOT DETECTED

## 2019-05-27 LAB — RESPIRATORY PANEL BY RT PCR (FLU A&B, COVID)
Influenza A by PCR: NEGATIVE
Influenza B by PCR: NEGATIVE
SARS Coronavirus 2 by RT PCR: NEGATIVE

## 2019-05-27 LAB — ACETAMINOPHEN LEVEL: Acetaminophen (Tylenol), Serum: <10 ug/mL — ABNORMAL LOW (ref 10–30)

## 2019-05-27 LAB — ETHANOL: Alcohol, Ethyl (B): <10 mg/dL (ref <10)

## 2019-05-27 MED ORDER — RISPERIDONE 1 MG PO TABS
1.0000 mg | ORAL_TABLET | Freq: Two times a day (BID) | ORAL | Status: DC
  Administered 2019-05-27 – 2019-05-28 (×3): 1 mg via ORAL
  Filled 2019-05-27 (×5): qty 1

## 2019-05-27 MED ORDER — DIVALPROEX SODIUM 250 MG PO DR TAB
250.0000 mg | DELAYED_RELEASE_TABLET | Freq: Three times a day (TID) | ORAL | Status: DC
  Administered 2019-05-27 – 2019-05-28 (×3): 250 mg via ORAL
  Filled 2019-05-27 (×3): qty 1

## 2019-05-27 MED ORDER — NICOTINE 21 MG/24HR TD PT24
21.0000 mg | MEDICATED_PATCH | Freq: Every day | TRANSDERMAL | Status: DC
  Administered 2019-05-28: 09:00:00 21 mg via TRANSDERMAL
  Filled 2019-05-27: qty 1

## 2019-05-27 NOTE — ED Notes (Addendum)
Pt arrived to Rm 49 - ambulatory - wearing burgundy scrubs. Pt voiced understanding of tx plan - overnight obs and reassess in AM. Sitter w/pt.

## 2019-05-27 NOTE — ED Notes (Signed)
TELE PSYCH machine at bedside

## 2019-05-27 NOTE — Progress Notes (Signed)
Patient ID: Sipriano Fendley, male   DOB: 1975-08-01, 44 y.o.   MRN: [id number]   I briefly spoke to Mr. Minniear about his current issues. He acknowledged a history of Bipolar I Disorder with recent mood changes (aggression and irritability). He stated that he was on Depakote and Risperdal although he had not taken the medications in 4 months as he has been unable to follow-up with [facility]. We discussed if he would like to restart the medications and he stated that he would. He stated that he preferred to restart Depakote at a lower dose. He was advised that the medications would be restarted (Depakote at a lower dose; 250 mg po BID) and Risperdal 1 mg po bid, we would observe him overnight safety and stability, and he would be reassessed by psychiatry in the morning. He was open to this recommendation.

## 2019-05-27 NOTE — ED Provider Notes (Signed)
[facility] EMERGENCY DEPARTMENT Provider Note   CSN: [id number] Arrival date & time: 05/27/19  0350     History Chief Complaint  Patient presents with  . Suicidal  . Homicidal    Randall [name] is a 44 y.o. male.  Patient with a history of bipolar, schizoaffective disorder presents to the ED with symptoms of SI, HI, intermittent AVH since being off his medications for the past several months. He has not attempted self harm prior to arrival. He has no physical complaints.   The history is provided by the patient. No language interpreter was used.       Past Medical History:  Diagnosis Date  . Bipolar 1 disorder ([facility])    under control  . History of kidney stones   . Sinus drainage    seasonal-spring    Patient Active Problem List   Diagnosis Date Noted  . Nephrolithiasis 12/23/2013    Past Surgical History:  Procedure Laterality Date  . CIRCUMCISION  age 40  . MULTIPLE EXTRACTIONS WITH ALVEOLOPLASTY N/A 12/13/2018   Procedure: MULTIPLE EXTRACTION WITH ALVEOLOPLASTY;  Surgeon: Ocie Doyne, DDS;  Location: [facility];  Service: Oral Surgery;  Laterality: N/A;  . NEPHROLITHOTOMY Right 12/23/2013   Procedure: RIGHT PERCUTANEOUS NEPHROLITHOTOMY WITH SURGEON ACCESS;  Surgeon: Crist Fat, MD;  Location: [facility];  Service: Urology;  Laterality: Right;       No family history on file.  Social History   Tobacco Use  . Smoking status: Current Every Day Smoker    Packs/day: 1.00    Types: Cigarettes  . Smokeless tobacco: Never Used  Substance Use Topics  . Alcohol use: Not Currently    Comment: occ  . Drug use: No    Home Medications Prior to Admission medications   Medication Sig Start Date End Date Taking? Authorizing Provider  amoxicillin (AMOXIL) 500 MG capsule Take 1 capsule (500 mg total) by mouth 3 (three) times daily. 12/13/18   Ocie Doyne, DDS  Cetirizine HCl 10 MG CAPS Take 1 capsule (10 mg total) by mouth daily. 03/29/19   Wieters,  Hallie C, PA-C  divalproex (DEPAKOTE) 500 MG DR tablet Take 500-1,000 mg by mouth See admin instructions. Take 500 mg by mouth every morning and 1000 mg at night    [provider]  fluticasone (FLONASE) 50 MCG/ACT nasal spray Place 1-2 sprays into both nostrils daily for 7 days. 03/29/19 04/05/19  Wieters, Hallie C, PA-C  oxyCODONE-acetaminophen (PERCOCET) 5-325 MG tablet Take 1 tablet by mouth every 4 (four) hours as needed. 12/13/18   Ocie Doyne, DDS  risperiDONE (RISPERDAL) 1 MG tablet Take 1 mg by mouth at bedtime.    [provider]    Allergies    Patient has no known allergies.  Review of Systems   Review of Systems  Constitutional: Negative for chills and fever.  HENT: Negative.   Respiratory: Negative.   Cardiovascular: Negative.   Gastrointestinal: Negative.   Musculoskeletal: Negative.   Skin: Negative.   Neurological: Negative.   Psychiatric/Behavioral: Positive for dysphoric mood, hallucinations and suicidal ideas. Negative for self-injury.    Physical Exam Updated Vital Signs BP 135/81 (BP Location: Left Arm)   Pulse 75   Temp 98.6 F (37 C) (Oral)   Resp 17   SpO2 98%   Physical Exam Vitals and nursing note reviewed.  Constitutional:      Appearance: He is well-developed.  HENT:     Head: Normocephalic.  Cardiovascular:  Rate and Rhythm: Normal rate and regular rhythm.  Pulmonary:     Effort: Pulmonary effort is normal.     Breath sounds: Normal breath sounds.  Abdominal:     General: Bowel sounds are normal.     Palpations: Abdomen is soft.     Tenderness: There is no abdominal tenderness. There is no guarding or rebound.  Musculoskeletal:        General: Normal range of motion.     Cervical back: Normal range of motion and neck supple.  Skin:    General: Skin is warm and dry.     Findings: No rash.  Neurological:     Mental Status: He is alert and oriented to person, place, and time.  Psychiatric:        Mood and Affect:  Mood is depressed.        Speech: Speech normal.        Behavior: Behavior is cooperative.        Thought Content: Thought content includes homicidal and suicidal ideation.     ED Results / Procedures / Treatments   Labs (all labs ordered are listed, but only abnormal results are displayed) Labs Reviewed  COMPREHENSIVE METABOLIC PANEL - Abnormal; Notable for the following components:      Result Value   Glucose, Bld 108 (*)    AST 59 (*)    All other components within normal limits  RESPIRATORY PANEL BY RT PCR (FLU A&B, COVID)  CBC  RAPID URINE DRUG SCREEN, HOSP PERFORMED  ETHANOL  SALICYLATE LEVEL  ACETAMINOPHEN LEVEL    EKG None  Radiology No results found.  Procedures Procedures (including critical care time)  Medications Ordered in ED Medications  nicotine (NICODERM CQ - dosed in mg/24 hours) patch 21 mg (has no administration in time range)    ED Course  I have reviewed the triage vital signs and the nursing notes.  Pertinent labs & imaging results that were available during my care of the patient were reviewed by me and considered in my medical decision making (see chart for details).    MDM Rules/Calculators/A&P                      Patient to ED with SI, HI and intermittent AVH (none currently). Ran out of medications about 4 months ago.   Feeling physically well. TTS consultation will be required to determine appropriate disposition.   Final Clinical Impression(s) / ED Diagnoses Final diagnoses:  None   1. SI 2. HI 3. Intermittent AVH  Rx / DC Orders ED Discharge Orders    None       Charlann Lange, PA-C 05/27/19 0544    Merryl Hacker, MD 05/27/19 [phone]

## 2019-05-27 NOTE — ED Notes (Signed)
Lunch Tray Ordered @ 1033. 

## 2019-05-27 NOTE — BH Assessment (Addendum)
Assessment Note  Randall [name] is an 44 y.o. male. He has a history of Bipolar I Disorder. He presents to [facility] stating that he goes to [facility] for his medications (Depakote and Risperdal). He has been off medications for the past 4 months. States that due to COVID he wasn't able to see the doctor as needed. He reached out to the doctor but states that never follow back up with him. He also states that  his last prescriptions were to strong for him to take and he would like them adjusted.   Since patient has been off medications he reports increased irritability. He has a history of aggression and irritability stating it's worsened in the past week. Last week he became argumentative and aggressive (physically/verbally) with his girlfriend. She kicked him out of her home. States, "I'm being aggressive with the people that mean the most to me" and "I'm pushing everyone away because of my anger". He denies current HI. Denies intent and plan. He is able to contract for safety against harming others. Denies legal issues, court dates, and is not on probation. Current stressors: facing an eviction because he loss his disability.   Patient denies SI. Denies prior history of SI. He denies self mutilating behaviors. He does acknowledge several depressive symptoms:  Feeling angry/irritable, Feeling worthless/self pity, Loss of interest in usual pleasures, Guilt, Fatigue, Isolating, Tearfulness, Insomnia, and Despondent. He is also experiencing severe anxiety but no panic attacks. He denies a family history of mental health illness.   Patient as auditory hallucinations that tell him that he is worthless and to go kill himself. He states, "It sounds like the devils voice". He denies visual hallucinations.   He denies alcohol and drug use.   Patient oriented to time, person, place, and situation. Speech is normal. Affect is sad and depressed. Patient is calm and pleasant. Insight and judgement are fair. Impulse control  is poor. Patient is dressed in scrubs.   Diagnosis: Bipolar I Disorder and Anxiety Disorder  Past Medical History:  Past Medical History:  Diagnosis Date  . Bipolar 1 disorder (HCC)    under control  . History of kidney stones   . Sinus drainage    seasonal-spring    Past Surgical History:  Procedure Laterality Date  . CIRCUMCISION  age 89  . MULTIPLE EXTRACTIONS WITH ALVEOLOPLASTY N/A 12/13/2018   Procedure: MULTIPLE EXTRACTION WITH ALVEOLOPLASTY;  Surgeon: Ocie Doyne, DDS;  Location: [facility] OR;  Service: Oral Surgery;  Laterality: N/A;  . NEPHROLITHOTOMY Right 12/23/2013   Procedure: RIGHT PERCUTANEOUS NEPHROLITHOTOMY WITH SURGEON ACCESS;  Surgeon: Crist Fat, MD;  Location: [facility] ORS;  Service: Urology;  Laterality: Right;    Family History: No family history on file.  Social History:  reports that he has been smoking cigarettes. He has been smoking about 1.00 pack per day. He has never used smokeless tobacco. He reports previous alcohol use. He reports that he does not use drugs.  Additional Social History:  Alcohol / Drug Use Pain Medications: See MAR Prescriptions: See MAR; Depakote 5mg  and Risperdal.."I take one in the morning and one at night" Over the Counter: See MAR History of alcohol / drug use?: No history of alcohol / drug abuse  CIWA: CIWA-Ar BP: 135/81 Pulse Rate: 75 COWS:    Allergies: No Known Allergies  Home Medications: (Not in a hospital admission)   OB/GYN Status:  No LMP for male patient.  General Assessment Data Location of Assessment: [facility] ED TTS Assessment: In system  Is this a Tele or Face-to-Face Assessment?: Tele Assessment Is this an Initial Assessment or a Re-assessment for this encounter?: Initial Assessment Patient Accompanied by:: (brought by [employer]) Language Other than English: No Living Arrangements: (lives in a apt currently but getting evicted) What gender do you identify as?: Male Marital status: Single Maiden name:  (n/a) Pregnancy Status: (n/a) Living Arrangements: Alone Can pt return to current living arrangement?: Yes(Yes but in the process of getting evicted) Admission Status: Voluntary Is patient capable of signing voluntary admission?: Yes Referral Source: Self/Family/Friend Insurance type: (Medicaid and [employer] Medicare )     Crisis Care Plan Living Arrangements: Alone Name of Psychiatrist: Beverly Sessions ) Name of Therapist: Beverly Sessions )  Education Status Is patient currently in school?: No Is the patient employed, unemployed or receiving disability?: (patient loss his disability November 01, 2019)  Risk to self with the past 6 months Suicidal Ideation: No Has patient been a risk to self within the past 6 months prior to admission? : No Suicidal Intent: No Has patient had any suicidal intent within the past 6 months prior to admission? : No Is patient at risk for suicide?: No Suicidal Plan?: No Has patient had any suicidal plan within the past 6 months prior to admission? : No Access to Means: No What has been your use of drugs/alcohol within the last 12 months?: (denies ) Previous Attempts/Gestures: Yes How many times?: (2013-"I tried to cut my wrist") Other Self Harm Risks: (denies ) Triggers for Past Attempts: ("My mom had to just passed away"; grief ) Intentional Self Injurious Behavior: None Family Suicide History: See progress notes(son has autism and ADHD) Recent stressful life event(s): (currently facing eviction-has to leave apt by June 30) Persecutory voices/beliefs?: No Depression: Yes Depression Symptoms: Feeling angry/irritable, Feeling worthless/self pity, Loss of interest in usual pleasures, Guilt, Fatigue, Isolating, Tearfulness, Insomnia, Despondent Substance abuse history and/or treatment for substance abuse?: No Suicide prevention information given to non-admitted patients: Not applicable  Risk to Others within the past 6 months Homicidal Ideation: No Does patient  have any lifetime risk of violence toward others beyond the six months prior to admission? : No Thoughts of Harm to Others: No Comment - Thoughts of Harm to Others: (n/a) Current Homicidal Intent: No Current Homicidal Plan: No Access to Homicidal Means: No Identified Victim: (n/a) History of harm to others?: Yes Assessment of Violence: In past 6-12 months("Just last weekend I snapped at my g-friend"-verbal &physica) Violent Behavior Description: (patient has a hx of snapping on g-friend and others ) Does patient have access to weapons?: No Criminal Charges Pending?: No Does patient have a court date: No Is patient on probation?: No  Psychosis Hallucinations: Auditory(Auditory-"sometimes I hear the devil...go kill yourself") Delusions: Unspecified("Voices tell me I'm not nothing")  Mental Status Report Appearance/Hygiene: In scrubs Eye Contact: Good Motor Activity: Freedom of movement Speech: Logical/coherent Level of Consciousness: Alert Mood: Pleasant, Sad Affect: Depressed, Sad Anxiety Level: Severe Thought Processes: Relevant Judgement: Impaired Orientation: Person, Place, Time, Situation Obsessive Compulsive Thoughts/Behaviors: None  Cognitive Functioning Concentration: Normal Memory: Recent Intact, Remote Intact Is patient IDD: No Insight: Fair Impulse Control: Fair Appetite: Poor Have you had any weight changes? : Loss Amount of the weight change? (lbs): (5 pounds) Sleep: Decreased Total Hours of Sleep: ("I stay up all night") Vegetative Symptoms: Staying in bed  ADLScreening [facility] Assessment Services) Patient's cognitive ability adequate to safely complete daily activities?: Yes Patient able to express need for assistance with ADLs?: No Independently performs ADLs?: Yes (appropriate  for developmental age)  Prior Inpatient Therapy Prior Inpatient Therapy: Yes Prior Therapy Dates: ("back when in 2011") Prior Therapy Facilty/Provider(s): ([facility] in [place],  [place]) Reason for Treatment: ("I was going to hurt one of my family members")  Prior Outpatient Therapy Prior Outpatient Therapy: Yes Prior Therapy Dates: V[facility] Mixer ) Prior Therapy Facilty/Provider(s): [facility] ) Reason for Treatment: (medication management) Does patient have an ACCT team?: No Does patient have Intensive In-House Services?  : No Does patient have [facility] services? : No Does patient have P4[facility] services?: No  ADL Screening (condition at time of admission) Patient's cognitive ability adequate to safely complete daily activities?: Yes Is the patient deaf or have difficulty hearing?: No Does the patient have difficulty seeing, even when wearing glasses/contacts?: No Does the patient have difficulty concentrating, remembering, or making decisions?: No Patient able to express need for assistance with ADLs?: No Does the patient have difficulty dressing or bathing?: No Independently performs ADLs?: Yes (appropriate for developmental age) Does the patient have difficulty walking or climbing stairs?: No Weakness of Legs: None Weakness of Arms/Hands: None  Home Assistive Devices/Equipment Home Assistive Devices/Equipment: None  Therapy Consults (therapy consults require a physician order) PT Evaluation Needed: No OT Evalulation Needed: No SLP Evaluation Needed: No Abuse/Neglect Assessment (Assessment to be complete while patient is alone) Abuse/Neglect Assessment Can Be Completed: Yes Physical Abuse: Denies Verbal Abuse: Denies Sexual Abuse: Denies Exploitation of patient/patient's resources: Denies Self-Neglect: Denies Values / Beliefs Cultural Requests During Hospitalization: None Spiritual Requests During Hospitalization: None Consults Spiritual Care Consult Needed: No Transition of Care Team Consult Needed: No Advance Directives (For Healthcare) Does Patient Have a Medical Advance Directive?: No Would patient like information on creating a medical advance  directive?: No - Patient declined Nutrition Screen- [facility] Patient's home diet: Regular Has the patient been eating poorly because of a decreased appetite?: No        Disposition: Per Denzil Magnuson, NP Disposition Initial Assessment Completed for this Encounter: Yes  On Site Evaluation by:   Reviewed with Physician:    Melynda Ripple 05/27/2019 8:47 AM

## 2019-05-27 NOTE — ED Notes (Signed)
ALL belongings inventoried - 2 labeled belongings bags - Orthoptist # 2 - Valuables Envelope - Security.

## 2019-05-27 NOTE — ED Triage Notes (Signed)
Pt reports that he has been off his psych meds for 4 months and now having SI and HI thoughts, reports AVH as well.

## 2019-05-28 DIAGNOSIS — F319 Bipolar disorder, unspecified: Secondary | ICD-10-CM | POA: Diagnosis not present

## 2019-05-28 MED ORDER — DIVALPROEX SODIUM 250 MG PO DR TAB
250.0000 mg | DELAYED_RELEASE_TABLET | Freq: Three times a day (TID) | ORAL | 2 refills | Status: DC
Start: 2019-05-28 — End: 2021-02-22

## 2019-05-28 MED ORDER — DIVALPROEX SODIUM 250 MG PO DR TAB
250.0000 mg | DELAYED_RELEASE_TABLET | Freq: Two times a day (BID) | ORAL | 2 refills | Status: DC
Start: 2019-05-28 — End: 2019-05-28

## 2019-05-28 MED ORDER — RISPERIDONE 1 MG PO TABS: 1.0000 mg | ORAL_TABLET | Freq: Two times a day (BID) | ORAL | 2 refills | Status: DC

## 2019-05-28 MED ORDER — DIVALPROEX SODIUM 250 MG PO DR TAB
250.0000 mg | DELAYED_RELEASE_TABLET | Freq: Three times a day (TID) | ORAL | 2 refills | Status: DC
Start: 2019-05-28 — End: 2019-05-28

## 2019-05-28 NOTE — Progress Notes (Signed)
Reassessment: Patient seen and chart reviewed. He presented to ED with complaints of increased irritability with aggression. Patient with history of bipolar disorder and was restarted on Depakote and Risperdal yesterday. On assessment today, he is sitting calmly in bed. He presents with euthymic mood and affect and normal rate/volume of speech. He reports improved mood since medications were restarted. He denies any SI/HI/AVH. He denies any irritability or aggressive. He shows no signs of responding to internal stimuli. No delusional thought content expressed. He states that he intends to continue medications after discharge and follow up with outpatient provider. He is requesting referral for outpatient follow-up for medication management. Patient does not have girlfriend's phone number with him but provided consent to contact brother Randall [name] from chart for collateral information. Attempted call x2 but with no response and no voicemail available.  Per TTS assessment 05/27/19: Randall [name] is an 44 y.o. male. He has a history of Bipolar I Disorder. He presents to [facility] stating that he goes to [facility] for his medications (Depakote and Risperdal). He has been off medications for the past 4 months. States that due to COVID he wasn't able to see the doctor as needed. He reached out to the doctor but states that never follow back up with him. He also states that  his last prescriptions were to strong for him to take and he would like them adjusted. Since patient has been off medications he reports increased irritability. He has a history of aggression and irritability stating it's worsened in the past week.  Disposition: Patient poses no acute risk of harm to self or others and does not meet criteria for psychiatric hospitalization. He is safe for discharge home with outpatient follow up. CSW notified of patient request for outpatient referral information. RN updated on disposition.

## 2019-05-28 NOTE — Discharge Instructions (Addendum)
Your divalproex (Depakote) is now dosed at 250 mg, 3 times daily. Your Risperdal will still be 1 mg twice daily. Follow-up with [facility] to schedule an appointment as soon as possible. Return to the emergency department for suicidal thoughts, homicidal thoughts, thoughts of self-harm, or other crisis or distress.

## 2019-05-28 NOTE — ED Notes (Signed)
Pt reports he was getting his px at [facility] but doc would not listen that he was on too high of a dose and felt like a zombie... that is why became noncompliant. Is willing to take again at smaller dose and get at [facility]. Denies SI and HI and states he is feeling better.

## 2019-05-28 NOTE — Progress Notes (Signed)
Pt has been psychiatrically cleared. Follow up instructions for outpatient therapy and psychiatry is documented in AVS.   Wells Guiles, LCSW, LCAS Disposition CSW [facility]/TTS [phone]

## 2019-05-28 NOTE — ED Provider Notes (Signed)
Patient was seen in the ED for SI, HI, intermittent AVH since being off his meds for the last several months.  He denied any attempts at self-harm prior to arrival. Patient states he stopped taking his medications because his dosing was too high and he could not afford the medications anyway. He adds that he had tried to call [facility], but did not get a response. He was evaluated by psych team here in the ED and he was restarted on his Risperdal as well as his Depakote, although at a lower dose.  Upon review of the psych NP notes, they state they are planning on restarting patient's Depakote at 250 mg BID, however, when they wrote the order for dosing during the patient's time in psych hold, they wrote it for 250 mg every 8 hours. Psych reevaluated the patient this morning and psychiatrically cleared him. Patient states he feels much better on the new Depakote dosing.  He denies SI, HI, and AVH.  Abnormal Labs Reviewed  COMPREHENSIVE METABOLIC PANEL - Abnormal; Notable for the following components:      Result Value   Glucose, Bld 108 (*)    AST 59 (*)    All other components within normal limits  SALICYLATE LEVEL - Abnormal; Notable for the following components:   Salicylate Lvl <7.0 (*)    All other components within normal limits  ACETAMINOPHEN LEVEL - Abnormal; Notable for the following components:   Acetaminophen (Tylenol), Serum <10 (*)    All other components within normal limits  VALPROIC ACID LEVEL - Abnormal; Notable for the following components:   Valproic Acid Lvl <10 (*)    All other components within normal limits   Physical Exam Vitals and nursing note reviewed.  Constitutional:      General: He is not in acute distress.    Appearance: He is well-developed. He is not diaphoretic.  HENT:     Head: Normocephalic and atraumatic.  Eyes:     Conjunctiva/sclera: Conjunctivae normal.  Cardiovascular:     Rate and Rhythm: Normal rate and regular rhythm.  Pulmonary:   Effort: Pulmonary effort is normal.  Musculoskeletal:     Cervical back: Neck supple.  Skin:    General: Skin is warm and dry.     Coloration: Skin is not pale.  Neurological:     Mental Status: He is alert and oriented to person, place, and time.  Psychiatric:        Mood and Affect: Mood normal.        Behavior: Behavior normal.    Patient contracts for safety.  Since there was a difference in the proposed frequency of Depakote administration in the original a psych NP note versus what was ordered for administration while he was here in the ED, I did additional research.  It seems as though 750 mg a day in split doses is the recommended starting regimen.  Therefore, I suspect the 250 mg 3 times daily dosing that was ordered for the patient is actually correct.  I reviewed and interpreted the patient's labs. Social work consult was utilized to assist the patient with obtaining his prescriptions.  They state because the patient has active insurance he does not qualify for financial assistance, however, they were able to obtain sample pricing for his prescriptions and patient states this cost is manageable for him. He will follow-up with [facility].  The procedure for doing so was discussed with him and detailed in his AVS. Crisis plan and other return  precautions reviewed.  Patient voices understanding of these instructions, accepts the plan, and is comfortable with discharge.    Vitals:   05/27/19 1738 05/27/19 2216 05/28/19 0555 05/28/19 1007  BP: 104/70 105/74 127/72 107/66  Pulse: (!) 59 (!) 59 (!) 57 77  Resp: 16 16 16 18   Temp: 98.5 F (36.9 C) 98.3 F (36.8 C) 97.9 F (36.6 C) 98.2 F (36.8 C)  TempSrc: Oral Oral Oral Oral  SpO2: 95% 96% 97% 96%       Layla Maw 05/28/19 1115    Wyvonnia Dusky, MD 05/28/19 [phone]

## 2019-05-28 NOTE — Progress Notes (Signed)
CSW received consult for patient for medication assistance. Per chart review, patient has active [facility]   CSW spoke with pharmacy staff member at the [facility] on [address] - the patient's copay for his depakote prescription is $3.70 fo500mg  tablets.  CSW spoke with , RN who states the patient will be able to obtain his medications on his own.  Lanora Manis, MSW, LCSW-A Transitions of Care  Clinical Social Worker  [facility] Emergency Departments  Medical ICU [phone]

## 2019-06-20 DIAGNOSIS — F6381 Intermittent explosive disorder: Secondary | ICD-10-CM | POA: Diagnosis not present

## 2019-08-29 DIAGNOSIS — F250 Schizoaffective disorder, bipolar type: Secondary | ICD-10-CM | POA: Diagnosis not present

## 2020-02-12 DIAGNOSIS — Z1152 Encounter for screening for COVID-19: Secondary | ICD-10-CM | POA: Diagnosis not present

## 2020-08-06 ENCOUNTER — Ambulatory Visit (HOSPITAL_COMMUNITY)
Admission: EM | Admit: 2020-08-06 | Discharge: 2020-08-06 | Disposition: A | Discharge status: Home / Self Care | Payer: Medicare HMO | Attending: Psychiatry | Admitting: Psychiatry

## 2020-08-06 ENCOUNTER — Other Ambulatory Visit: Payer: Self-pay

## 2020-08-06 DIAGNOSIS — F250 Schizoaffective disorder, bipolar type: Secondary | ICD-10-CM | POA: Diagnosis not present

## 2020-08-06 NOTE — ED Notes (Signed)
Patient is alert and oriented X 4, received AVS with community resources to follow up. Patient denies SI/ HI and AVH during discharge.

## 2020-08-06 NOTE — ED Provider Notes (Signed)
Behavioral Health Urgent Care Medical Screening Exam  Patient Name: Randall [name] MRN: [id number] Date of Evaluation: 08/06/20 Chief Complaint:   Diagnosis:  Final diagnoses:  Schizoaffective disorder, bipolar type (HCC)    History of Present illness: Randall [name] is a 45 y.o. male.  with past history of schizoaffective disorder bipolar type and intermittent explosive disorder  presented to [facility] for medication management. Patient states that he had been off his medication for last 10 months because he was at [employer].  Patient states he had been on Depakote 500 mg twice daily and risperidone 1 mg once daily.  Patient states his girlfriend who is also bipolar told him to start his medication again. Currently,  Patient denies any suicidal ideation or homicidal ideation.  Patient denies any auditory and visual hallucinations currently however states that he sees and hears his dead mother sometimes.  Patient denies any command hallucinations.  Patient denies depressed mood, anhedonia, low energy, fatigue, hopelessness, helplessness, worthlessness or problems with sleep or appetite.  Patient denies anxiety but endorses racing thoughts sometimes.  Patient states he had suicidal ideation 2 years ago.  Denies any history of self cutting.  Patient denies using any illegal drugs or drinking alcohol.  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  Patient followed at [facility] in the past but states currently they do not have any providers to see him.  Patient lives with his girlfriend and 3 kids in [place].  Patient works in Aeronautical engineer. Psychiatric Specialty Exam  Presentation  General Appearance:Appropriate for Environment  Eye Contact:Good  Speech:Clear and Coherent  Speech Volume:Normal  Handedness:Right   Mood and Affect  Mood:Euthymic  Affect:Appropriate   Thought Process  Thought Processes:Coherent  Descriptions of Associations:Intact  Orientation:Full (Time, Place and  Person)  Thought Content:Logical    Hallucinations:None (Sometimes sees and hears voice of dead mother) sometimes Hears voice of dead mom. sometimes sees dead mom.  Ideas of Reference:None  Suicidal Thoughts:No  Homicidal Thoughts:No   Sensorium  Memory:Immediate Good; Recent Good; Remote Good  Judgment:Good  Insight:Good   Executive Functions  Concentration:Good  Attention Span:Good  Recall:Good  Fund of Knowledge:Good  Language:Good   Psychomotor Activity  Psychomotor Activity:Normal   Assets  Assets:Communication Skills; Desire for Improvement; Financial Resources/Insurance; Housing; Leisure Time; Physical Health; Resilience; Social Support   Sleep  Sleep:Good  Number of hours:  No data recorded  No data recorded  Physical Exam: Physical Exam Vitals and nursing note reviewed.  Constitutional:      General: He is not in acute distress.    Appearance: Normal appearance. He is not ill-appearing, toxic-appearing or diaphoretic.  HENT:     Head: Normocephalic and atraumatic.  Pulmonary:     Effort: Pulmonary effort is normal.  Musculoskeletal:        General: Normal range of motion.  Neurological:     General: No focal deficit present.     Mental Status: He is alert and oriented to person, place, and time.  Psychiatric:        Mood and Affect: Mood normal.        Behavior: Behavior normal.        Judgment: Judgment normal.   Review of Systems  Constitutional:  Negative for chills and fever.  Eyes:  Negative for blurred vision.  Respiratory:  Negative for cough.   Cardiovascular:  Negative for chest pain.  Gastrointestinal:  Negative for nausea and vomiting.  Neurological:  Negative for dizziness and headaches.  Psychiatric/Behavioral:  Negative for depression, hallucinations, substance abuse and suicidal ideas. The patient is not nervous/anxious.   Blood pressure 120/84, pulse 79, temperature 97.9 F (36.6 C), temperature source Oral, resp.  rate 16, SpO2 98 %. There is no height or weight on file to calculate BMI.  Musculoskeletal: Strength & Muscle Tone: within normal limits Gait & Station: normal Patient leans: N/A   [facility] MSE Discharge Disposition for Follow up and Recommendations: Based on my evaluation the patient does not appear to have an emergency medical condition and can be discharged with resources and follow up care in outpatient services for Medication Management Patient does not meet criteria for inpatient hospitalization.  Patient is not suicidal, homicidal or psychotic.  Patient can be discharged with outpatient psychiatry follow-up for medication management.  Patient offered to come to [facility] outpatient walk-in clinic.  Patient verbalizes understanding.  Karsten Ro, MD 08/06/2020, 1:27 PM

## 2020-08-06 NOTE — BH Assessment (Signed)
TTS triage: Patient presents to [facility] seeking medication assistance. He states he has been off his Depakote and Risperidal and his "old lady" wants him back on it. He denies current SI/HI/AVH. She states he does experience AH of his mother who is deceased at times, but it is not distressing to him and is comforting. 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 He is a former [employer] patient.  Patient is routine.

## 2020-08-06 NOTE — Discharge Instructions (Addendum)
   Please come to [facility] (this facility) during walk in hours for appointment with psychiatrist for further medication management and for therapists for therapy.    Walk in hours are 8-11 AM Monday through Thursday for medication management.Child and adolescent psychiatrists are only available on Wednesdays and Thursdays during walk in hours.  Therapy walk in hours are Monday-Wednesday 8 AM-1PM.   It is first come, first -serve; it is best to arrive by 7:00 AM.   On Friday from 1 pm to 4 pm for therapy intake only. Please arrive by 12:00 pm as it is  first come, first -serve.    When you arrive please go upstairs for your appointment. If you are unsure of where to go, inform the front desk that you are here for a walk in appointment and they will assist you with directions upstairs.  Address:  [address] in [place], [address] Ph: [phone]    Patient agreeable to plan to follow up with outpatient provider.  Given opportunity to ask questions.  Appears to feel comfortable with discharge denies any current suicidal or homicidal thought. Patient has been instructed & cautioned: To not engage in alcohol and or illegal drug use while on prescription medicines. In the event of worsening symptoms, patient is instructed to call the crisis hotline, 911 and or go to the nearest ED for appropriate evaluation and treatment of symptoms. To follow-up with his primary care provider for your other medical issues, concerns and or health care needs.

## 2020-08-10 ENCOUNTER — Telehealth (HOSPITAL_COMMUNITY): Payer: Self-pay | Admitting: *Deleted

## 2020-08-10 NOTE — Telephone Encounter (Signed)
Call from patient needing his medicine to be refilled. Only seen downstairs at the [facility], states he came in yesterday but sent home because our system was down. Explained he can come in again as a walk in, that was an unusual set of circumstances and I was sorry about that but we cant give him medicine if we have never seen him, and a scheduled appt is probably mid Sept. He will try to come in tomorrow.

## 2020-11-01 ENCOUNTER — Encounter (HOSPITAL_COMMUNITY): Payer: Self-pay | Admitting: Psychiatry

## 2020-11-01 ENCOUNTER — Ambulatory Visit (INDEPENDENT_AMBULATORY_CARE_PROVIDER_SITE_OTHER): Payer: Medicare HMO | Admitting: Psychiatry

## 2020-11-01 ENCOUNTER — Other Ambulatory Visit: Payer: Self-pay

## 2020-11-01 VITALS — BP 121/87 | HR 67 | Ht 73.0 in | Wt 185.0 lb

## 2020-11-01 DIAGNOSIS — F250 Schizoaffective disorder, bipolar type: Secondary | ICD-10-CM | POA: Diagnosis not present

## 2020-11-01 MED ORDER — RISPERIDONE 2 MG PO TABS: 2.0000 mg | ORAL_TABLET | Freq: Every day | ORAL | 3 refills | Status: DC
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
## 2020-11-23 ENCOUNTER — Ambulatory Visit (HOSPITAL_COMMUNITY)
Admission: EM | Admit: 2020-11-23 | Discharge: 2020-11-23 | Disposition: A | Discharge status: Home / Self Care | Payer: Medicare HMO | Attending: Physician Assistant | Admitting: Physician Assistant

## 2020-11-23 ENCOUNTER — Other Ambulatory Visit: Payer: Self-pay

## 2020-11-23 ENCOUNTER — Encounter (HOSPITAL_COMMUNITY): Payer: Self-pay | Admitting: Emergency Medicine

## 2020-11-23 DIAGNOSIS — R0981 Nasal congestion: Secondary | ICD-10-CM

## 2020-11-23 DIAGNOSIS — J069 Acute upper respiratory infection, unspecified: Secondary | ICD-10-CM | POA: Diagnosis not present

## 2020-11-23 DIAGNOSIS — R051 Acute cough: Secondary | ICD-10-CM

## 2020-11-23 MED ORDER — FLUTICASONE PROPIONATE 50 MCG/ACT NA SUSP: 1.0000 | Freq: Every day | NASAL | 0 refills | Status: DC | PRN

## 2020-11-23 MED ORDER — CETIRIZINE HCL 10 MG PO CAPS
10.0000 mg | ORAL_CAPSULE | Freq: Every day | ORAL | 0 refills | Status: DC
Start: 2020-11-23 — End: 2022-11-09

## 2020-11-23 MED ORDER — PREDNISONE 20 MG PO TABS
40.0000 mg | ORAL_TABLET | Freq: Every day | ORAL | 0 refills | Status: AC
Start: 2020-11-23 — End: 2020-11-27

## 2020-11-23 NOTE — Discharge Instructions (Signed)
I think you have a virus.  We did not test you for anything given your symptoms have been present for a week.  Please start cetirizine daily.  Use Flonase twice daily for 1 week then decrease to once daily thereafter.  I have called in some prednisone to help with your symptoms.  Please take 40 mg for 4 days.  Do not take NSAIDs including aspirin, ibuprofen/Advil, naproxen/Aleve with this medication because it can cause stomach bleeding.  You can use Tylenol, Mucinex, Flonase for symptom relief.  Make sure you are resting and drinking plenty of fluid.  If you have any worsening symptoms please return for reevaluation.

## 2020-11-23 NOTE — ED Triage Notes (Signed)
Pt is present today with cough, sore throat, and nasal congestion. Pt states sx started last week

## 2020-11-23 NOTE — ED Provider Notes (Signed)
[facility]    CSN: [id number] Arrival date & time: 11/23/20  1916      History   Chief Complaint Chief Complaint  Patient presents with   Cough   Nasal Congestion   Sore Throat    HPI Randall [name] is a 45 y.o. male.   Patient presents today with a week plus history of symptoms.  Reports nasal congestion, cough, sore throat.  Denies any fever, chest pain, shortness of breath, nausea, vomiting.  He has tried Benadryl without improvement of symptoms.  Denies any known sick contacts but does report significant other developed symptoms after he did.  He does have a history of allergies and reports symptoms are similar but more extreme than previous episodes of this condition.  He denies history of COVID.  Denies history of asthma, COPD.  He is a current smoker.  Denies any recent antibiotic use.   Past Medical History:  Diagnosis Date   Bipolar 1 disorder (HCC)    under control   History of kidney stones    Sinus drainage    seasonal-spring    Patient Active Problem List   Diagnosis Date Noted   Nephrolithiasis 12/23/2013    Past Surgical History:  Procedure Laterality Date   CIRCUMCISION  age 43   MULTIPLE EXTRACTIONS WITH ALVEOLOPLASTY N/A 12/13/2018   Procedure: MULTIPLE EXTRACTION WITH ALVEOLOPLASTY;  Surgeon: Ocie Doyne, DDS;  Location: [facility] OR;  Service: Oral Surgery;  Laterality: N/A;   NEPHROLITHOTOMY Right 12/23/2013   Procedure: RIGHT PERCUTANEOUS NEPHROLITHOTOMY WITH SURGEON ACCESS;  Surgeon: Crist Fat, MD;  Location: [facility] ORS;  Service: Urology;  Laterality: Right;       Home Medications    Prior to Admission medications   Medication Sig Start Date End Date Taking? Authorizing Provider  predniSONE (DELTASONE) 20 MG tablet Take 2 tablets (40 mg total) by mouth daily for 4 days. 11/23/20 11/27/20 Yes Thelonious Kauffmann, Noberto Retort, PA-C  Cetirizine HCl 10 MG CAPS Take 1 capsule (10 mg total) by mouth daily. 11/23/20   Jermarcus Mcfadyen, Noberto Retort, PA-C  divalproex  (DEPAKOTE) 250 MG DR tablet Take 1 tablet (250 mg total) by mouth 3 (three) times daily. 05/28/19 08/26/19  Joy, Shawn C, PA-C  fluticasone (FLONASE) 50 MCG/ACT nasal spray Place 1-2 sprays into both nostrils daily as needed for up to 7 days for allergies. 11/23/20 11/30/20  Yarlin Breisch, Noberto Retort, PA-C  risperiDONE (RISPERDAL) 2 MG tablet Take 1 tablet (2 mg total) by mouth at bedtime. 11/01/20   Shanna Cisco, NP    Family History History reviewed. No pertinent family history.  Social History Social History   Tobacco Use   Smoking status: Every Day    Packs/day: 1.00    Types: Cigarettes   Smokeless tobacco: Never  Vaping Use   Vaping Use: Never used  Substance Use Topics   Alcohol use: Not Currently    Comment: occ   Drug use: No     Allergies   Patient has no known allergies.   Review of Systems Review of Systems  Constitutional:  Positive for activity change. Negative for appetite change, fatigue and fever.  HENT:  Positive for congestion and sore throat. Negative for sinus pressure and sneezing.   Respiratory:  Positive for cough. Negative for shortness of breath.   Cardiovascular:  Negative for chest pain.  Gastrointestinal:  Negative for abdominal pain, diarrhea, nausea and vomiting.  Musculoskeletal:  Negative for arthralgias and myalgias.  Neurological:  Negative for dizziness, light-headedness and  headaches.    Physical Exam Triage Vital Signs ED Triage Vitals [11/23/20 2015]  Enc Vitals Group     BP 120/80     Pulse Rate 72     Resp 18     Temp 98.4 F (36.9 C)     Temp src      SpO2 100 %     Weight      Height      Head Circumference      Peak Flow      Pain Score 0     Pain Loc      Pain Edu?      Excl. in GC?    No data found.  Updated Vital Signs BP 120/80   Pulse 72   Temp 98.4 F (36.9 C)   Resp 18   SpO2 100%   Visual Acuity Right Eye Distance:   Left Eye Distance:   Bilateral Distance:    Right Eye Near:   Left Eye Near:     Bilateral Near:     Physical Exam Vitals reviewed.  Constitutional:      General: He is awake.     Appearance: Normal appearance. He is well-developed. He is not ill-appearing.     Comments: Very pleasant male appears stated age no acute distress sitting comfortably in exam room  HENT:     Head: Normocephalic and atraumatic.     Right Ear: Tympanic membrane, ear canal and external ear normal. Tympanic membrane is not erythematous or bulging.     Left Ear: Tympanic membrane, ear canal and external ear normal. Tympanic membrane is not erythematous or bulging.     Ears:     Comments: Partial cerumen impaction bilaterally; able to visualize 20% of TM that appears normal.    Nose: Nose normal.     Right Sinus: No maxillary sinus tenderness or frontal sinus tenderness.     Left Sinus: No maxillary sinus tenderness or frontal sinus tenderness.     Mouth/Throat:     Pharynx: Uvula midline. No oropharyngeal exudate, posterior oropharyngeal erythema or uvula swelling.     Comments: Drainage present posterior pharynx Cardiovascular:     Rate and Rhythm: Normal rate and regular rhythm.     Heart sounds: Normal heart sounds, S1 normal and S2 normal. No murmur heard. Pulmonary:     Effort: Pulmonary effort is normal. No accessory muscle usage or respiratory distress.     Breath sounds: Normal breath sounds. No stridor. No wheezing, rhonchi or rales.     Comments: Clear to auscultation bilaterally Neurological:     Mental Status: He is alert.  Psychiatric:        Behavior: Behavior is cooperative.     UC Treatments / Results  Labs (all labs ordered are listed, but only abnormal results are displayed) Labs Reviewed - No data to display  EKG   Radiology No results found.  Procedures Procedures (including critical care time)  Medications Ordered in UC Medications - No data to display  Initial Impression / Assessment and Plan / UC Course  I have reviewed the triage vital signs and  the nursing notes.  Pertinent labs & imaging results that were available during my care of the patient were reviewed by me and considered in my medical decision making (see chart for details).     No indication for viral testing given patient has been symptomatic for more than a week and this would not change management.  No evidence of  acute infection that would warrant initiation of antibiotics today.  Patient was instructed to begin allergy medication and sent in cetirizine 10 mg daily as well as Flonase to manage symptoms.  We will start prednisone burst (40 mg x 4 days) for additional symptom relief.  He was instructed not to take NSAIDs with this medication due to risk of GI bleeding.  He can use Mucinex and Tylenol for additional symptom relief.  Recommended he rest and drink plenty of fluid.  Discussed alarm symptoms that warrant emergent evaluation.  Strict return precautions given to which he expressed understanding.  Final Clinical Impressions(s) / UC Diagnoses   Final diagnoses:  Upper respiratory tract infection, unspecified type  Acute cough  Nasal congestion     Discharge Instructions      I think you have a virus.  We did not test you for anything given your symptoms have been present for a week.  Please start cetirizine daily.  Use Flonase twice daily for 1 week then decrease to once daily thereafter.  I have called in some prednisone to help with your symptoms.  Please take 40 mg for 4 days.  Do not take NSAIDs including aspirin, ibuprofen/Advil, naproxen/Aleve with this medication because it can cause stomach bleeding.  You can use Tylenol, Mucinex, Flonase for symptom relief.  Make sure you are resting and drinking plenty of fluid.  If you have any worsening symptoms please return for reevaluation.     ED Prescriptions     Medication Sig Dispense Auth. Provider   Cetirizine HCl 10 MG CAPS Take 1 capsule (10 mg total) by mouth daily. 30 capsule Marveline Profeta K, PA-C    fluticasone (FLONASE) 50 MCG/ACT nasal spray Place 1-2 sprays into both nostrils daily as needed for up to 7 days for allergies. 16 g Kyria Bumgardner K, PA-C   predniSONE (DELTASONE) 20 MG tablet Take 2 tablets (40 mg total) by mouth daily for 4 days. 8 tablet Letisha Yera, Noberto Retort, PA-C      PDMP not reviewed this encounter.   Jeani Hawking, PA-C 11/23/20 2052

## 2021-02-01 ENCOUNTER — Telehealth (HOSPITAL_COMMUNITY): Payer: Medicare HMO | Admitting: Psychiatry

## 2021-02-01 ENCOUNTER — Encounter (HOSPITAL_COMMUNITY): Payer: Self-pay

## 2021-02-22 ENCOUNTER — Encounter (HOSPITAL_COMMUNITY): Payer: Self-pay | Admitting: Psychiatry

## 2021-02-22 ENCOUNTER — Other Ambulatory Visit: Payer: Self-pay

## 2021-02-22 ENCOUNTER — Telehealth (INDEPENDENT_AMBULATORY_CARE_PROVIDER_SITE_OTHER): Payer: Medicare HMO | Admitting: Psychiatry

## 2021-02-22 DIAGNOSIS — F250 Schizoaffective disorder, bipolar type: Secondary | ICD-10-CM

## 2021-02-22 DIAGNOSIS — F411 Generalized anxiety disorder: Secondary | ICD-10-CM

## 2021-02-22 MED ORDER — HYDROXYZINE HCL 10 MG PO TABS: 10.0000 mg | ORAL_TABLET | Freq: Three times a day (TID) | ORAL | 3 refills | Status: DC | PRN

## 2021-02-22 MED ORDER — RISPERIDONE 2 MG PO TABS: 2.0000 mg | ORAL_TABLET | Freq: Every day | ORAL | 3 refills | Status: DC

## 2021-02-22 NOTE — Progress Notes (Signed)
BH MD/PA/NP OP Progress Note Virtual Visit via Telephone Note  I connected with Randall [name] on 02/22/21 at  9:30 AM EST by telephone and verified that I am speaking with the correct person using two identifiers.  Location: Patient: home Provider: Clinic   I discussed the limitations, risks, security and privacy concerns of performing an evaluation and management service by telephone and the availability of in person appointments. I also discussed with the patient that there may be a patient responsible charge related to this service. The patient expressed understanding and agreed to proceed.   I provided 30 minutes of non-face-to-face time during this encounter.  02/22/2021 9:52 AM Randall [name]  MRN:  [id number]  Chief Complaint: "Im so so. Im still having episodes where I snap on my love ones"  HPI: 46 year old male seen today for follow up psychiatric evaluation. He has a psychiatric history of schizoaffective disorder bipolar type and intermittent explosive disorder. He is currently managed on Risperdal 2 mg nightly. He notes his medications are somewhat effective in managing his psychiatric conditions.      Today he is unable to logon virtually so his assessment was done over the phone.  He notes that he is doing so-so.  He informed Clinical research associate that he has been more irritable and snapping on his loved ones.  He notes that he gets in their face when overwhelmed and at times has pushed them.  He denies other symptoms of mania.  Today he also denies SI/HI/VAH or paranoia.  Patient notes that he would like to speak to a counselor to help cope with his irritability.  Provider referred patient to outpatient counseling for therapy.    Despite the above patient endorses minimal anxiety and depression.  Provider conducted a GAD-7 and patient scored an 8, at his last visit he scored an 11.  Provider also conducted PHQ-9 and patient scored a 5, at his last visit he scored 10.  He endorses adequate sleep and  appetite.  Since his last visit he reports that he is lost 10 to 15 pounds.  He reports that he is active at work ([employer])   Today he is agreeable to starting hydroxyzine 10 mg 3 times daily as needed to help manage anxiety.   At this time Depakote not restarted as patient notes that he prefers to speak to counselor first.  Provider referred patient to outpatient counseling for therapy.  No other concerns noted at this time.  Visit Diagnosis:    ICD-10-CM   1. Generalized anxiety disorder  F41.1 hydrOXYzine (ATARAX) 10 MG tablet    Ambulatory referral to Social Work    2. Schizoaffective disorder, bipolar type (HCC)  F25.0 risperiDONE (RISPERDAL) 2 MG tablet    Ambulatory referral to Social Work      Past Psychiatric History: schizoaffective disorder bipolar type and intermittent explosive disorder   Past Medical History:  Past Medical History:  Diagnosis Date   Bipolar 1 disorder (HCC)    under control   History of kidney stones    Sinus drainage    seasonal-spring    Past Surgical History:  Procedure Laterality Date   CIRCUMCISION  age 16   MULTIPLE EXTRACTIONS WITH ALVEOLOPLASTY N/A 12/13/2018   Procedure: MULTIPLE EXTRACTION WITH ALVEOLOPLASTY;  Surgeon: Ocie Doyne, DDS;  [facility]: [facility];  Service: Oral Surgery;  Laterality: N/A;   NEPHROLITHOTOMY Right 12/23/2013   Procedure: RIGHT PERCUTANEOUS NEPHROLITHOTOMY WITH SURGEON ACCESS;  Surgeon: Crist Fat, MD;  Location: [facility];  Service: Urology;  Laterality: Right;    Family Psychiatric History: Father alcohol use  Family History: History reviewed. No pertinent family history.  Social History:  Social History   Socioeconomic History   Marital status: Single    Spouse name: Not on file   Number of children: Not on file   Years of education: Not on file   Highest education level: Not on file  Occupational History   Not on file  Tobacco Use   Smoking status: Every Day    Packs/day: 1.00     Types: Cigarettes   Smokeless tobacco: Never  Vaping Use   Vaping Use: Never used  Substance and Sexual Activity   Alcohol use: Not Currently    Comment: occ   Drug use: No   Sexual activity: Not on file  Other Topics Concern   Not on file  Social History Narrative   Not on file   Social Determinants of Health   Financial Resource Strain: Not on file  Food Insecurity: Not on file  Transportation Needs: Not on file  Physical Activity: Not on file  Stress: Not on file  Social Connections: Not on file    Allergies: No Known Allergies  Metabolic Disorder Labs: No results found for: HGBA1C, MPG No results found for: PROLACTIN No results found for: CHOL, TRIG, HDL, CHOLHDL, VLDL, LDLCALC No results found for: TSH  Therapeutic Level Labs: No results found for: LITHIUM Lab Results  Component Value Date   VALPROATE <10 (L) 05/27/2019   VALPROATE <10 (L) 08/30/2018   No components found for:  CBMZ  Current Medications: Current Outpatient Medications  Medication Sig Dispense Refill   hydrOXYzine (ATARAX) 10 MG tablet Take 1 tablet (10 mg total) by mouth 3 (three) times daily as needed. 90 tablet 3   Cetirizine HCl 10 MG CAPS Take 1 capsule (10 mg total) by mouth daily. 30 capsule 0   fluticasone (FLONASE) 50 MCG/ACT nasal spray Place 1-2 sprays into both nostrils daily as needed for up to 7 days for allergies. 16 g 0   risperiDONE (RISPERDAL) 2 MG tablet Take 1 tablet (2 mg total) by mouth at bedtime. 60 tablet 3   No current facility-administered medications for this visit.     Musculoskeletal: Strength & Muscle Tone:  Unable to assess due to telephone visit Gait & Station:  Unable to assess due to telephone visit Patient leans: N/A  Psychiatric Specialty Exam: Review of Systems  There were no vitals taken for this visit.There is no height or weight on file to calculate BMI.  General Appearance:  Unable to assess due to telephone visit  Eye Contact:   Unable to  assess due to telephone visit  Speech:  Clear and Coherent and Normal Rate  Volume:  Normal  Mood:  Anxious and Irritable  Affect:   Unable to assess due to telephone visit  Thought Process:  Coherent, Goal Directed, and Linear  Orientation:  Full (Time, Place, and Person)  Thought Content: WDL and Logical   Suicidal Thoughts:  No  Homicidal Thoughts:  No  Memory:  Immediate;   Good Recent;   Good Remote;   Good  Judgement:  Good  Insight:  Good  Psychomotor Activity:   Unable to assess due to telephone visit  Concentration:  Concentration: Good and Attention Span: Good  Recall:  Good  Fund of Knowledge: Good  Language: Good  Akathisia:   Unable to assess due to telephone visit  Handed:  Right  AIMS (if indicated): not done  Assets:  Communication Skills Desire for Improvement Financial Resources/Insurance Housing Intimacy Physical Health Social Support  ADL's:  Intact  Cognition: WNL  Sleep:  Good   Screenings: GAD-7    Flowsheet Row [facility]  Total GAD-7 Score 8 11      PHQ2-9    Flowsheet Row [facility]  PHQ-2 Total Score 2 2  PHQ-9 Total Score 5 10      Flowsheet Row [facility]  C-SSRS RISK CATEGORY No Risk Error: Q6 is Yes, you must answer 7 High Risk        Assessment and Plan: Patient endorses increased irritability.Today he is agreeable to starting hydroxyzine 10 mg 3 times daily as needed to help manage anxiety.  At this time Depakote not restarted as patient notes that he prefers to speak to counselor  first.  Provider referred patient to outpatient counseling for therapy.  1. Schizoaffective disorder, bipolar type (HCC)  Continue- risperiDONE (RISPERDAL) 2 MG tablet; Take 1 tablet (2 mg total) by mouth at bedtime.  Dispense: 60 tablet; Refill: 3 - Ambulatory referral to Social Work  2. Generalized anxiety disorder  Start- hydrOXYzine (ATARAX) 10 MG tablet; Take 1 tablet (10 mg total) by mouth 3 (three) times daily as needed.  Dispense: 90 tablet; Refill: 3 - Ambulatory referral to Social Work    Shanna CiscoBrittney E Takelia Urieta, NP 02/22/2021, 9:52 AM

## 2021-04-25 ENCOUNTER — Ambulatory Visit (HOSPITAL_COMMUNITY): Payer: Medicare HMO | Admitting: Clinical

## 2021-05-05 ENCOUNTER — Telehealth (HOSPITAL_COMMUNITY): Payer: Medicare HMO | Admitting: Psychiatry

## 2021-05-31 ENCOUNTER — Other Ambulatory Visit (HOSPITAL_COMMUNITY): Payer: Self-pay | Admitting: Psychiatry

## 2021-05-31 DIAGNOSIS — F411 Generalized anxiety disorder: Secondary | ICD-10-CM

## 2021-06-08 ENCOUNTER — Telehealth (HOSPITAL_COMMUNITY): Payer: Self-pay | Admitting: *Deleted

## 2021-06-08 NOTE — Telephone Encounter (Signed)
Call stating he needs rx sent to his pharmacy. Explained to him we were not able to continue with medicines till he is seen here by a provider, he was last seen on 02/22/21. He reports he is in the homeless ministry in [place]. Explained then he would need to find a provider in [facility], we only see patients living in [place], or return to this county and see a provider, we are unable to help him with new rxs at this time. ?

## 2021-08-18 ENCOUNTER — Other Ambulatory Visit (HOSPITAL_COMMUNITY): Payer: Self-pay | Admitting: Psychiatry

## 2021-08-18 DIAGNOSIS — F250 Schizoaffective disorder, bipolar type: Secondary | ICD-10-CM

## 2021-08-18 NOTE — Telephone Encounter (Signed)
Patient has not been seen by a provider at this office since 02/22/2021.  He would need to be seen again before any prescription could be prescribed.    Arna Snipe MD Resident

## 2021-10-04 ENCOUNTER — Other Ambulatory Visit (HOSPITAL_COMMUNITY): Payer: Self-pay | Admitting: Psychiatry

## 2021-10-04 DIAGNOSIS — F411 Generalized anxiety disorder: Secondary | ICD-10-CM

## 2021-10-24 ENCOUNTER — Encounter (HOSPITAL_COMMUNITY): Payer: Self-pay | Admitting: Student in an Organized Health Care Education/Training Program

## 2021-10-24 ENCOUNTER — Ambulatory Visit (INDEPENDENT_AMBULATORY_CARE_PROVIDER_SITE_OTHER): Payer: Medicare HMO | Admitting: Student in an Organized Health Care Education/Training Program

## 2021-10-24 VITALS — BP 135/90 | HR 71 | Resp 16 | Wt 209.0 lb

## 2021-10-24 DIAGNOSIS — F411 Generalized anxiety disorder: Secondary | ICD-10-CM | POA: Diagnosis not present

## 2021-10-24 DIAGNOSIS — F250 Schizoaffective disorder, bipolar type: Secondary | ICD-10-CM | POA: Diagnosis not present

## 2021-10-24 MED ORDER — HYDROXYZINE HCL 10 MG PO TABS: 10.0000 mg | ORAL_TABLET | Freq: Three times a day (TID) | ORAL | 3 refills | Status: DC | PRN

## 2021-10-24 MED ORDER — RISPERIDONE 1 MG PO TABS: 1.0000 mg | ORAL_TABLET | Freq: Every day | ORAL | 1 refills | Status: DC

## 2021-10-24 NOTE — Patient Instructions (Signed)
Primary Care Provider Options  [place] Please Call [phone] to schedule for an appt to Establish Care, please let them know you are receiving mental health care in the [facility] [address], [place], [place] [address] Monday-Friday: 8 AM-5:30 PM Phone [phone]   [facility] [address], [place], [place], [place] Monday-Friday: 8 AM-4:30 PM Phone [phone] Family medicine  Patient Care at [facility] [address] [place], [place], [place] [address] Monday-Friday: 8 AM-5 PM Phone [phone] [facility] medicine  [facility] [address] [place], [place] [address] Monday-Friday: 8 AM-5:30 PM Phone: [phone] Primary CARE

## 2021-10-24 NOTE — Progress Notes (Signed)
Psychiatric Initial Adult Assessment   Patient Identification: Randall [name] MRN:  [id number] Date of Evaluation:  10/24/2021 Referral Source: Self Chief Complaint:   Chief Complaint  Patient presents with   Establish Care   Visit Diagnosis:    ICD-10-CM   1. Schizoaffective disorder, bipolar type ([place])  F25.0 risperiDONE (RISPERDAL) 1 MG tablet    2. Generalized anxiety disorder  F41.1 hydrOXYzine (ATARAX) 10 MG tablet      History of Present Illness:  Randall [name] is a 46 yo patient with PPH of schizoaffective disorder, bipolar type and GAD. Patient reports that he has been compliant with his Risperdal 2mg  QHS and Hydroxyzine 10mg  TID PRN, despite not knowing who is writing for the rx's he is receiving them through a special pharmacy and endorses that he last took them yesterday.   Patient reports that he is hear to make sure that his medications are appropriate and essentially to reestablish care. Patient reports that he is also looking for a PCP for his physical health. Patient reports that he is concerned that his Risperdal is oversedating. Patient reports that he usually takes it at 8pm and is not waking up until 8-10 am when he would normally wake up around 5-6 am. Patient reports that his sleep is good and he does feel that the medication helps with his irritability and underlying agitation.   Patient reports that he feels his mood has been "good." Patient reports that he does have some increase anhedonia lately but has not been feeling "depressed." Patient denies feelings of guilt/ hopelessness/ worthlessness. Patient endorses that he feels his over all energy level and appetite have been good and denies SI and HI. Patient reports that he does think his concentration has been poorer than normal lately. Patient endorses that he is still having intermittent AVH. Patient reports that his last VH was approx 2 days when he saw his mother. Patient endorses that his deceased mother is his  guardian angel and that this VH did not disturb him. However, his AH is more of a bother. Patient reports that he hears 2 unrecognizable voices outside of his head "like whispers" that suggests he return to [facility] and drugs.  Patient reports that he also constantly feels "a little nervous." Patient reports that he thinks taking the Hydroxyzine 10mg  TID and the risperdal help take some of the edge off, but he still has the feeling. Patient reports that he is constantly worried and endorses that he has some muscle tension as a result and endorses being restless, but denies that he wants to crawl out of skin. 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 Patient reports that he has also had a long hx of irritable mood and this has led to him being fired from a job due to getting into disagreements with customers. Patient has a documented hx of endorsing his irritability causing problems with family and patient currently endorses he is no longer in much contact with his biological family. Patient endorses that his irritability is a concern for him and endorses that he he struggles when others "interrupt my peace." Patient does not give much details other than "popping off."  Patient endorses a hx of hypomanic episodes endorses periods where he can go 3 days without a need for sleep. Patient reports that his GF will notice his rapid speech  and he becomes more sexually active. Patient reports that he will have racing thoughts and go for long walks around town due to increased energy.  Patient denies any symptoms of PTSD.  Associated Signs/Symptoms: Depression Symptoms:  anhedonia, psychomotor agitation, difficulty concentrating, anxiety, (Hypo) Manic Symptoms:   denies Anxiety Symptoms:  Excessive Worry, Psychotic Symptoms:  Hallucinations: Auditory Visual PTSD  Symptoms: NA  Past Psychiatric History:  INPT: Denies Oupt: Since childhood, was also seen by [facility] as adult Dx: In [place] initially diagnosed with undetermined mood disorder, this later was changed to Schizoaffective disorder, bipolar type, Dx with GAD, received tx for this as a child, Childhood dx of Learning disability was in Special education classes from K- 9th grade.  Previous medications: Prozac (childhood, was not helpful eluded to this worsening behavior as a child), Zyprexa (unsure but believes he may have still had significant irritability on this), Risperdal, Depakote (beneficial)  Previous Psychotropic Medications: Yes   Substance Abuse History in the last 12 months:  No.  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  Tobacco: 1ppd   Past Medical History:  Past Medical History:  Diagnosis Date   Bipolar 1 disorder (HCC)    under control   History of kidney stones    Sinus drainage    seasonal-spring    Past Surgical History:  Procedure Laterality Date   CIRCUMCISION  age 89   MULTIPLE EXTRACTIONS WITH ALVEOLOPLASTY N/A 12/13/2018   Procedure: MULTIPLE EXTRACTION WITH ALVEOLOPLASTY;  Surgeon: Ocie Doyne, DDS;  Location: [facility] OR;  Service: Oral Surgery;  Laterality: N/A;   NEPHROLITHOTOMY Right 12/23/2013   Procedure: RIGHT PERCUTANEOUS NEPHROLITHOTOMY WITH SURGEON ACCESS;  Surgeon: Crist Fat, MD;  [facility];  Service: Urology;  Laterality: Right;    Family Psychiatric History:  1 son: Born premature, has Autism and ADHD 1 daughter: Autism Father: Etoh use disorder  Family History: No family history on file.  Social History:   Social History   Socioeconomic History   Marital status: Single    Spouse name: Not on file   Number of children: Not on file   Years of education: Not on file   Highest education level: Not on file  Occupational History   Not on file  Tobacco Use   Smoking status:  Every Day    Packs/day: 1.00    Types: Cigarettes   Smokeless tobacco: Never  Vaping Use   Vaping Use: Never used  Substance and Sexual Activity   Alcohol use: Not Currently    Comment: occ   Drug use: No   Sexual activity: Not on file  Other Topics Concern   Not on file  Social History Narrative   Not on file   Social Determinants of Health   Financial Resource Strain: Not on file  Food Insecurity: Not on file  Transportation Needs: Not on file  Physical Activity: Not on file  Stress: Not on file  Social Connections: Not on file    Additional Social History:  - Lives with GF in [place], she and her family are his support - has 3 children - graduated from [employer] - did "some" trade school - on SSI - 6 siblings and he is close with none  Allergies:  No Known Allergies  Metabolic Disorder Labs: No results found for: "HGBA1C", "MPG" No results found for: "PROLACTIN" No results found for: "CHOL", "TRIG", "HDL", "CHOLHDL", "VLDL", "LDLCALC" No results found for: "TSH"  Therapeutic Level  Labs: No results found for: "LITHIUM" No results found for: "CBMZ" Lab Results  Component Value Date   VALPROATE <10 (L) 05/27/2019    Current Medications: Current Outpatient Medications  Medication Sig Dispense Refill   risperiDONE (RISPERDAL) 1 MG tablet Take 1 tablet (1 mg total) by mouth at bedtime. 30 tablet 1   Cetirizine HCl 10 MG CAPS Take 1 capsule (10 mg total) by mouth daily. 30 capsule 0   fluticasone (FLONASE) 50 MCG/ACT nasal spray Place 1-2 sprays into both nostrils daily as needed for up to 7 days for allergies. 16 g 0   hydrOXYzine (ATARAX) 10 MG tablet Take 1 tablet (10 mg total) by mouth 3 (three) times daily as needed. 90 tablet 3   No current facility-administered medications for this visit.    Musculoskeletal: Strength & Muscle Tone: within normal limits Gait & Station: broad based Patient leans: Front  Psychiatric Specialty Exam: Review of Systems   Psychiatric/Behavioral:  Positive for hallucinations. Negative for confusion, dysphoric mood, sleep disturbance and suicidal ideas. The patient is nervous/anxious.     Blood pressure (!) 135/90, pulse 71, resp. rate 16, weight 209 lb (94.8 kg), SpO2 99 %.Body mass index is 27.57 kg/m.  General Appearance: Casual, patient appears uncomfortable but denies this, patient appears a bit restless  Eye Contact:  Fair  Speech:  Clear and Coherent  Volume:  Normal  Mood:  Anxious  Affect:  Restricted  Thought Process:  Goal Directed  Orientation:  Full (Time, Place, and Person)  Thought Content:  Logical  Suicidal Thoughts:  No  Homicidal Thoughts:  No  Memory:  Immediate;   Good Recent;   Fair Remote;   Fair  Judgement:  Fair  Insight:  Shallow  Psychomotor Activity:  Restlessness  Concentration:  Concentration: Fair  Recall:  [employer] of Knowledge:Fair  Language: Fair  Akathisia:  Yes   Handed:    AIMS (if indicated):  done  Assets:  Communication Skills Desire for Improvement Financial Resources/Insurance Housing Resilience Social Support  ADL's:  Intact  Cognition: WNL  Sleep:  Good   Screenings: AIMS    Flowsheet Row Clinical Support [facility]24/20[facility]  AIMS Total Score 0      GAD-7    Flowsheet Row Clinical Support from 10/24/2021 in [facility] Video Visit from 02/22/2021 in [facility] Office Visit from 11/01/2020 in [facility]  Total GAD-7 Score 7 8 11       PHQ2-9    Flowsheet Row Clinical Support from 10/24/2021 in [facility] Video Visit from 02/22/2021 in [facility] Office Visit from 11/01/2020 in [facility]  PHQ-2 Total Score 3 2 2   PHQ-9 Total Score 11 5 10       Flowsheet Row ED from 11/23/2020 in [facility]  from [facility]/27/2019 in [facility] ED from [facility]/30/2018 in [facility]  C-SSRS RISK CATEGORY No Risk Error: Q6 is Yes, you must answer 7 High Risk       Assessment and Plan:  Alva Broxson is a 46 yo patient with PPH of Schizoaffective disorder, bipolar type and GAD. Objectively patient appeared restless. Patient was also noted to have what appeared to be a broad based gait and strange front leaning posture when walking and sitting. Patient endorsed constant underlying anxiety. Patient has  been on Risperdal for 6+ years per patient, the likelihood these behaviors are medication induced akathisia is lower than TD; however patient did not appear to have TD like movements. Despite this will continue to monitor for both. Lowering patient's Risperdal may help with the restless symptoms and hypersomnia, if it does but patient has worsening mood or AVH may consider changing to less potent D2r antagonist and/or adding Depakote.  Schizoaffective disorder, bipolar type - Decrease Risperdal to 1mg  QHS due to oversedation  GAD - Continue Hydroxyzine 10mg  TID  F/u in approx 1 mon  Collaboration of Care:   Patient/Guardian was advised Release of Information must be obtained prior to any record release in order to collaborate their care with an outside provider. Patient/Guardian was advised if they have not already done so to contact the registration department to sign all necessary forms in order for to release information regarding their care.   Consent: Patient/Guardian gives verbal consent for treatment and assignment of benefits for services provided during this visit. Patient/Guardian expressed understanding and agreed to proceed.    PGY-3 , MD 10/2/202310:56 AM

## 2021-11-23 ENCOUNTER — Ambulatory Visit (INDEPENDENT_AMBULATORY_CARE_PROVIDER_SITE_OTHER): Payer: Medicare HMO | Admitting: Psychiatry

## 2021-11-23 VITALS — BP 132/85 | HR 73

## 2021-11-23 DIAGNOSIS — F250 Schizoaffective disorder, bipolar type: Secondary | ICD-10-CM | POA: Diagnosis not present

## 2021-11-23 DIAGNOSIS — F17210 Nicotine dependence, cigarettes, uncomplicated: Secondary | ICD-10-CM

## 2021-11-23 DIAGNOSIS — Z79899 Other long term (current) drug therapy: Secondary | ICD-10-CM

## 2021-11-23 MED ORDER — NICOTINE POLACRILEX 2 MG MT GUM: 2.0000 mg | CHEWING_GUM | OROMUCOSAL | 0 refills | Status: DC | PRN

## 2021-11-23 MED ORDER — RISPERIDONE 0.5 MG PO TABS: 0.5000 mg | ORAL_TABLET | Freq: Every day | ORAL | 1 refills | Status: DC

## 2021-11-23 NOTE — Progress Notes (Signed)
BH MD/PA/NP OP Progress Note  11/23/2021 1:55 PM Randall Randall [name]  MRN:  [id number]  Chief Complaint:  Chief Complaint  Patient presents with   Follow-up   HPI: Randall Randall [name] is a 46 yo patient with PPH of schizoaffective Randall [name], bipolar type and GAD seen for follow-up for medication management.  He is currently being managed on risperidone 1 mg nightly, and hydroxyzine 10 mg 3 times daily for anxiety.  Pt reports that his mood has been "stable".  He reports that he is still having outburst with his girlfriend and whenever somebody aggravates him.  He has had outburst 5 time since last visit.  He reports that risperidone helps him fall asleep but does not help much with his mood.  He has been sleeping and eating well .  Currently, He denies any suicidal ideations, homicidal ideations. He is endorsing visual hallucinations and sees [facility] about twice per week , and hears his mom's voice once in a while.  His mom passed away in 05/23/2005.   He denies paranoia.  He denies any medication side effects and has been tolerating it well.  He reports that he was on risperidone 2 mg twice daily but it was reduced to 1 mg nightly only at last visit due to oversedation. He reports no change in his current stressors.  He denies drinking alcohol and using any illicit drugs.  He used to smoke marijuana but stopped in 2011-05-24.  He smoke cigarettes 1 pack a day.  Offered nicotine supplements if he needs help with quitting.  He agrees to getting nicotine gum.  Discussed starting low-dose of risperidone during daytime to help with agitation episodes. He agrees with the plan. Discussed getting labs including lipid panel, A1c, CBC, and CMP.  He reports that he has an appointment with PCP soon and will get his labs done there. He denies any other concerns. Patient is alert and oriented x 4,  calm, cooperative, and fully engaged in conversation during the encounter.  His thought process is coherent with coherent speech . He does not  appear to be responding to internal/external stimuli .    Visit Diagnosis:    ICD-10-CM   1. Schizoaffective Randall [name], bipolar type (HCC)  F25.0 risperiDONE (RISPERDAL) 0.5 MG tablet    2. Cigarette nicotine dependence without complication  F17.210 nicotine polacrilex (NICORETTE) 2 MG gum    3. Long term current use of antipsychotic medication  Z79.899 Lipid Profile    HgB A1c    CBC w/Diff/Platelet    COMPLETE METABOLIC PANEL WITH GFR      Past Psychiatric History: INPT: Denies Oupt: Since childhood, was also seen by [facility] as adult Dx: In [place] initially diagnosed with undetermined mood Randall [name], this later was changed to Schizoaffective Randall [name], bipolar type, Dx with GAD, received tx for this as a child, Childhood dx of Learning disability was in Special education classes from K- 9th grade.   Previous medications: Prozac (childhood, was not helpful eluded to this worsening behavior as a child), Zyprexa (unsure but believes he may have still had significant irritability on this), Risperdal, Depakote (beneficial)    Past Medical History:  Past Medical History:  Diagnosis Date   Bipolar 1 Randall [name]    under control   History of kidney stones    Sinus drainage    seasonal-spring    Past Surgical History:  Procedure Laterality Date   CIRCUMCISION  age 33   MULTIPLE EXTRACTIONS WITH ALVEOLOPLASTY N/A 12/13/2018   Procedure: MULTIPLE EXTRACTION  WITH ALVEOLOPLASTY;  Surgeon: Diona Browner, DDS;  Location: [place];  Service: Oral Surgery;  Laterality: N/A;   NEPHROLITHOTOMY Right 12/23/2013   Procedure: RIGHT PERCUTANEOUS NEPHROLITHOTOMY WITH SURGEON ACCESS;  Surgeon: Ardis Hughs, MD;  Location: [facility];  Service: Urology;  Laterality: Right;    Family Psychiatric History: 1 son: Born premature, has Autism and ADHD 1 daughter: Autism Father: Randall Randall [name]    Family History: No family history on file.  Social History:  Social History   Socioeconomic History    Marital status: Single    Spouse name: Not on file   Number of children: Not on file   Years of education: Not on file   Highest education level: Not on file  Occupational History   Not on file  Tobacco Use   Smoking status: Every Day    Packs/day: 1.00    Types: Cigarettes   Smokeless tobacco: Never  Vaping Use   Vaping Use: Never used  Substance and Sexual Activity   Alcohol use: Not Currently    Comment: occ   Drug use: No   Sexual activity: Not on file  Other Topics Concern   Not on file  Social History Narrative   Not on file   Social Determinants of Health   Financial Resource Strain: Not on file  Food Insecurity: Not on file  Transportation Needs: Not on file  Physical Activity: Not on file  Stress: Not on file  Social Connections: Not on file    Allergies: No Known Allergies  Metabolic Randall [name] Labs: No results found for: "HGBA1C", "MPG" No results found for: "PROLACTIN" No results found for: "CHOL", "TRIG", "HDL", "CHOLHDL", "VLDL", "LDLCALC" No results found for: "TSH"  Therapeutic Level Labs: No results found for: "LITHIUM" Lab Results  Component Value Date   VALPROATE <10 (L) 05/27/2019   VALPROATE <10 (L) 08/30/2018   No results found for: "CBMZ"  Current Medications: Current Outpatient Medications  Medication Sig Dispense Refill   nicotine polacrilex (NICORETTE) 2 MG gum Take 1 each (2 mg total) by mouth as needed for smoking cessation. 100 tablet 0   risperiDONE (RISPERDAL) 0.5 MG tablet Take 1 tablet (0.5 mg total) by mouth daily. 30 tablet 1   Cetirizine HCl 10 MG CAPS Take 1 capsule (10 mg total) by mouth daily. 30 capsule 0   fluticasone (FLONASE) 50 MCG/ACT nasal spray Place 1-2 sprays into both nostrils daily as needed for up to 7 days for allergies. 16 g 0   hydrOXYzine (ATARAX) 10 MG tablet Take 1 tablet (10 mg total) by mouth 3 (three) times daily as needed. 90 tablet 3   risperiDONE (RISPERDAL) 1 MG tablet Take 1 tablet (1 mg  total) by mouth at bedtime. 30 tablet 1   No current facility-administered medications for this visit.     Musculoskeletal: Strength & Muscle Tone: within normal limits Gait & Station: normal Patient leans: N/A  Psychiatric Specialty Exam: Review of Systems  Blood pressure 132/85, pulse 73, SpO2 99 %.There is no height or weight on file to calculate BMI.  General Appearance: Casual  Eye Contact:  Good  Speech:  Clear and Coherent and Normal Rate  Volume:  Normal  Mood:  Euthymic  Affect:  Constricted  Thought Process:  Goal Directed and Linear  Orientation:  Full (Time, Place, and Person)  Thought Content: Hallucinations: Auditory  Visual Hallucinations- once in a while.  Suicidal Thoughts:  No  Homicidal Thoughts:  No  Memory:  Immediate;  Good Recent;   Good  Judgement:  Fair  Insight:  Fair  Psychomotor Activity:  Normal  Concentration:  Concentration: Good and Attention Span: Good  Recall:  Good  Fund of Knowledge: Good  Language: Good  Akathisia:  No  Handed:  Right  AIMS (if indicated): not done  Assets:  Communication Skills Desire for Improvement Financial Resources/Insurance Housing Resilience Social Support  ADL's:  Intact  Cognition: WNL  Sleep:  Good   Screenings: AIMS    Flowsheet Row Clinical Support from 10/24/2021 in [facility]  AIMS Total Score 0      GAD-7    Flowsheet Row Clinical Support from 10/24/2021 in [facility] Video Visit from 02/22/2021 in [facility] Office Visit from 11/01/2020 in [facility]  Total GAD-7 Score 7 8 11       PHQ2-9    Flowsheet Row Clinical Support from 10/24/2021 in [facility] Video Visit from 02/22/2021 in [facility] Office Visit from 11/01/2020 in [facility]  PHQ-2 Total Score 3 2 2   PHQ-9 Total Score  11 5 10       Flowsheet Row ED from 11/23/2020 in [facility] ED from 05/27/2019 in [facility] ED from 08/30/2018 in [facility] EM[facility]GENCY DEPARTMENT  C-SSRS RISK CATEGORY No Risk Error: Q6 is Yes, you must answer 7 High Risk        Assessment and Plan: Jamesrobert Ohanesian is a 46 yo patient with PPH of Schizoaffective Randall [name], bipolar type and GAD. Objectively patient appeared restless.  Patient reports stable mood but still having aggressive outbursts frequently.  He is still endorsing visual hallucinations and rare auditory hallucinations.  Will add low dose daytime risperidone to help with outburst and hallucinations.  Offered nicotine supplement for nicotine dependence and he agrees with trying nicotine gum. Will order lipid panel, A1c, CBC and CMP labs due to antipsychotic use. Patient will get it done from PCP office.  Schizoaffective Randall [name], bipolar type -Continue Risperdal to 1mg  QHS for mood stabilization -Start risperidone 0.5 mg daily for agitation episodes.   GAD - Continue Hydroxyzine 10mg  TID   Cigarette nicotine dependence without complication -Start nicotine gum as needed  Long-term current use of antipsychotic medication -Lipid panel, HbA1c, CBC, CMP ordered.  Patient will get it done from PCP office.  F/u in approx 1 mon   Collaboration of Care:    Patient/Guardian was advised Release of Information must be obtained prior to any record release in order to collaborate their care with an outside provider. Patient/Guardian was advised if they have not already done so to contact the registration department to sign all necessary forms in order for [facility] to release information regarding their care.   Consent: Patient/Guardian gives verbal consent for treatment and assignment of benefits for services provided during this visit. Patient/Guardian expressed understanding and agreed to proceed.    Wonda Horner, MD 11/23/2021, 1:55 PM

## 2021-12-07 ENCOUNTER — Encounter: Payer: Self-pay | Admitting: Nurse Practitioner

## 2021-12-07 ENCOUNTER — Ambulatory Visit (INDEPENDENT_AMBULATORY_CARE_PROVIDER_SITE_OTHER): Payer: Medicare HMO | Admitting: Nurse Practitioner

## 2021-12-07 VITALS — BP 123/88 | HR 86 | Ht 73.0 in | Wt 218.0 lb

## 2021-12-07 DIAGNOSIS — Z0000 Encounter for general adult medical examination without abnormal findings: Secondary | ICD-10-CM | POA: Diagnosis not present

## 2021-12-07 DIAGNOSIS — Z1329 Encounter for screening for other suspected endocrine disorder: Secondary | ICD-10-CM

## 2021-12-07 DIAGNOSIS — Z13220 Encounter for screening for lipoid disorders: Secondary | ICD-10-CM | POA: Diagnosis not present

## 2021-12-07 LAB — POCT URINALYSIS DIP (CLINITEK)
Bilirubin, UA: NEGATIVE
Blood, UA: NEGATIVE
Glucose, UA: NEGATIVE mg/dL
Ketones, POC UA: NEGATIVE mg/dL
Leukocytes, UA: NEGATIVE
Nitrite, UA: NEGATIVE
POC PROTEIN,UA: NEGATIVE
Spec Grav, UA: >=1.03 — AB (ref 1.010–1.025)
Urobilinogen, UA: 1 E.U./dL
pH, UA: 5 (ref 5.0–8.0)

## 2021-12-07 NOTE — Assessment & Plan Note (Signed)
-   POCT URINALYSIS DIP (CLINITEK) - CBC - Comprehensive metabolic panel - Hemoglobin A1c  2. Lipid screening  - Lipid Panel  3. Thyroid disorder screen  - TSH   Follow up:  Follow up in 3 months or sooner if needed

## 2021-12-07 NOTE — Progress Notes (Signed)
@Patient  ID: , male    DOB: 11-Oct-1975, 46 y.o.   MRN: [id number]  Chief Complaint  Patient presents with   Annual Exam    Referring provider: No ref. provider found   HPI  Patient presents today to establish care and for complete physical.  Patient does have a history of mental illness and does see [facility] behavioral health for this.  Overall patient has been doing well.  He states that he does have a strong family history of heart disease and his mother also died of lung cancer.  Patient states that he no longer drinks or smokes marijuana.  He quit in 2013.  He is a current every day cigarette smoker. Denies f/c/s, n/v/d, hemoptysis, PND, leg swelling Denies chest pain or edema     No Known Allergies   There is no immunization history on file for this patient.  Past Medical History:  Diagnosis Date   Bipolar 1 disorder (HCC)    under control   History of kidney stones    Sinus drainage    seasonal-spring    Tobacco History: Social History   Tobacco Use  Smoking Status Every Day   Packs/day: 1.00   Types: Cigarettes  Smokeless Tobacco Never   Ready to quit: Not Answered Counseling given: Not Answered   Outpatient Encounter Medications as of 12/07/2021  Medication Sig   Cetirizine HCl 10 MG CAPS Take 1 capsule (10 mg total) by mouth daily.   hydrOXYzine (ATARAX) 10 MG tablet Take 1 tablet (10 mg total) by mouth 3 (three) times daily as needed.   risperiDONE (RISPERDAL) 0.5 MG tablet Take 1 tablet (0.5 mg total) by mouth daily.   risperiDONE (RISPERDAL) 1 MG tablet Take 1 tablet (1 mg total) by mouth at bedtime.   fluticasone (FLONASE) 50 MCG/ACT nasal spray Place 1-2 sprays into both nostrils daily as needed for up to 7 days for allergies.   [DISCONTINUED] nicotine polacrilex (NICORETTE) 2 MG gum Take 1 each (2 mg total) by mouth as needed for smoking cessation.   No facility-administered encounter medications on file as of 12/07/2021.      Review of Systems  Review of Systems  Constitutional: Negative.   HENT: Negative.    Cardiovascular: Negative.   Gastrointestinal: Negative.   Allergic/Immunologic: Negative.   Neurological: Negative.   Psychiatric/Behavioral: Negative.         Physical Exam  BP 123/88   Pulse 86   Ht 6\' 1"  (1.854 m)   Wt 218 lb (98.9 kg)   SpO2 98%   BMI 28.76 kg/m   Wt Readings from Last 5 Encounters:  12/07/21 218 lb (98.9 kg)  10/24/21 209 lb (94.8 kg)  11/01/20 185 lb (83.9 kg)  12/13/18 201 lb (91.2 kg)  12/23/13 207 lb (93.9 kg)     Physical Exam Vitals and nursing note reviewed.  Constitutional:      General: He is not in acute distress.    Appearance: He is well-developed.  Cardiovascular:     Rate and Rhythm: Normal rate and regular rhythm.  Pulmonary:     Effort: Pulmonary effort is normal.     Breath sounds: Normal breath sounds.  Skin:    General: Skin is warm and dry.  Neurological:     Mental Status: He is alert and oriented to person, place, and time.      Assessment & Plan:   Routine adult health maintenance - POCT URINALYSIS DIP (CLINITEK) - CBC -  Comprehensive metabolic panel - Hemoglobin A1c  2. Lipid screening  - Lipid Panel  3. Thyroid disorder screen  - TSH   Follow up:  Follow up in 3 months or sooner if needed     Ivonne Andrew, NP 12/07/2021

## 2021-12-07 NOTE — Patient Instructions (Signed)
1. Routine adult health maintenance  - POCT URINALYSIS DIP (CLINITEK) - CBC - Comprehensive metabolic panel - Hemoglobin A1c  2. Lipid screening  - Lipid Panel  3. Thyroid disorder screen  - TSH   Follow up:  Follow up in 3 months or sooner if needed

## 2021-12-08 ENCOUNTER — Other Ambulatory Visit: Payer: Self-pay | Admitting: Nurse Practitioner

## 2021-12-08 ENCOUNTER — Ambulatory Visit (HOSPITAL_COMMUNITY): Payer: Medicare HMO | Admitting: Mental Health

## 2021-12-08 DIAGNOSIS — E782 Mixed hyperlipidemia: Secondary | ICD-10-CM

## 2021-12-08 LAB — LIPID PANEL
Chol/HDL Ratio: 6.7 ratio — ABNORMAL HIGH (ref 0.0–5.0)
Cholesterol, Total: 227 mg/dL — ABNORMAL HIGH (ref 100–199)
HDL: 34 mg/dL — ABNORMAL LOW (ref >39)
LDL Chol Calc (NIH): 170 mg/dL — ABNORMAL HIGH (ref 0–99)
Triglycerides: 123 mg/dL (ref 0–149)
VLDL Cholesterol Cal: 23 mg/dL (ref 5–40)

## 2021-12-08 LAB — CBC
Hematocrit: 42.1 % (ref 37.5–51.0)
Hemoglobin: 14.5 g/dL (ref 13.0–17.7)
MCH: 29.2 pg (ref 26.6–33.0)
MCHC: 34.4 g/dL (ref 31.5–35.7)
MCV: 85 fL (ref 79–97)
Platelets: 248 10*3/uL (ref 150–450)
RBC: 4.97 x10E6/uL (ref 4.14–5.80)
RDW: 12 % (ref 11.6–15.4)
WBC: 7.7 10*3/uL (ref 3.4–10.8)

## 2021-12-08 LAB — COMPREHENSIVE METABOLIC PANEL
ALT: 27 IU/L (ref 0–44)
AST: 21 IU/L (ref 0–40)
Albumin/Globulin Ratio: 1.7 (ref 1.2–2.2)
Albumin: 4.9 g/dL (ref 4.1–5.1)
Alkaline Phosphatase: 64 IU/L (ref 44–121)
BUN/Creatinine Ratio: 11 (ref 9–20)
BUN: 12 mg/dL (ref 6–24)
Bilirubin Total: 0.3 mg/dL (ref 0.0–1.2)
CO2: 24 mmol/L (ref 20–29)
Calcium: 9.5 mg/dL (ref 8.7–10.2)
Chloride: 102 mmol/L (ref 96–106)
Creatinine, Ser: 1.09 mg/dL (ref 0.76–1.27)
Globulin, Total: 2.9 g/dL (ref 1.5–4.5)
Glucose: 86 mg/dL (ref 70–99)
Potassium: 4.1 mmol/L (ref 3.5–5.2)
Sodium: 139 mmol/L (ref 134–144)
Total Protein: 7.8 g/dL (ref 6.0–8.5)
eGFR: 86 mL/min/{1.73_m2} (ref >59)

## 2021-12-08 LAB — HEMOGLOBIN A1C
Est. average glucose Bld gHb Est-mCnc: 128 mg/dL
Hgb A1c MFr Bld: 6.1 % — ABNORMAL HIGH (ref 4.8–5.6)

## 2021-12-08 LAB — TSH: TSH: 0.943 u[IU]/mL (ref 0.450–4.500)

## 2021-12-08 MED ORDER — SIMVASTATIN 10 MG PO TABS: 10.0000 mg | ORAL_TABLET | Freq: Every evening | ORAL | 11 refills | Status: DC

## 2021-12-22 ENCOUNTER — Encounter (HOSPITAL_COMMUNITY): Payer: Medicare HMO | Admitting: Student in an Organized Health Care Education/Training Program

## 2021-12-30 ENCOUNTER — Ambulatory Visit (INDEPENDENT_AMBULATORY_CARE_PROVIDER_SITE_OTHER): Payer: Medicare HMO | Admitting: Student in an Organized Health Care Education/Training Program

## 2021-12-30 ENCOUNTER — Encounter (HOSPITAL_COMMUNITY): Payer: Self-pay | Admitting: Student in an Organized Health Care Education/Training Program

## 2021-12-30 VITALS — BP 126/81 | HR 78 | Wt 219.0 lb

## 2021-12-30 DIAGNOSIS — F17210 Nicotine dependence, cigarettes, uncomplicated: Secondary | ICD-10-CM

## 2021-12-30 DIAGNOSIS — F250 Schizoaffective disorder, bipolar type: Secondary | ICD-10-CM | POA: Diagnosis not present

## 2021-12-30 MED ORDER — DIVALPROEX SODIUM ER 500 MG PO TB24: 500.0000 mg | ORAL_TABLET | Freq: Every day | ORAL | 2 refills | Status: DC

## 2021-12-30 MED ORDER — RISPERIDONE 0.5 MG PO TABS: 0.5000 mg | ORAL_TABLET | Freq: Every day | ORAL | 2 refills | Status: DC

## 2021-12-30 MED ORDER — RISPERIDONE 1 MG PO TABS: 1.0000 mg | ORAL_TABLET | Freq: Every day | ORAL | 2 refills | Status: DC

## 2021-12-30 MED ORDER — NICOTINE 7 MG/24HR TD PT24: 7.0000 mg | MEDICATED_PATCH | Freq: Every day | TRANSDERMAL | 0 refills | Status: DC

## 2021-12-30 NOTE — Progress Notes (Signed)
BH MD/PA/NP OP Progress Note  12/30/2021 3:34 PM Randall [name]  MRN:  [id number]  Chief Complaint:  Chief Complaint  Patient presents with   Follow-up   HPI: Randall HornerBruce [name] is a 46 yo patient with PPH of schizoaffective disorder, bipolar type and GAD seen for follow-up for medication management.  He is currently being managed on risperidone 0.5mg  in the day and 1 mg nightly, and hydroxyzine 10 mg 3 times daily for anxiety.  Patient reports that he feels that the Risperdal has been helping his auditory hallucinations.  Patient reports now he is just "getting bad vibes", but he also continues to hear a negative voice tell him to do things that he should not and that he will not act on them.  Patient reports that now he is just going for a walk to help him feel better and ignore the voices.  Patient reports that he is most concerned about his irritability and anger.  Patient reports that he would classify his mood as a "7/10" with a 10 being bad.  Patient reports he would rate himself as 7 because of his anger.  Patient reports although he does not have SI HI or VH he is very concerned about his impulsivity and how frequently he is getting in verbal altercations especially with his "lady friend" who he is living with.  Patient reports that he wants to get better control of his impulsivity.  Patient reports that otherwise his appetite and sleep have been good and he has been working towards getting his own place.  Patient provider discussed patient getting back on Depakote.  Patient reports he would like to restart his medication as he found it beneficial in the past.  Patient reports that otherwise he has been trying to quit smoking is currently at 4 cigarettes a day however he did not find Nicorette gum beneficial.  Patient reports he is willing to try patches. Visit Diagnosis:    ICD-10-CM   1. Schizoaffective disorder, bipolar type (HCC)  F25.0 divalproex (DEPAKOTE ER) 500 MG 24 hr tablet     risperiDONE (RISPERDAL) 1 MG tablet    risperiDONE (RISPERDAL) 0.5 MG tablet    2. Cigarette nicotine dependence without complication  F17.210 nicotine (NICODERM CQ) 7 mg/24hr patch      Past Psychiatric History:   INPT: Denies Oupt: Since childhood, was also [facility] Dx: In [place] initially diagnosed with undetermined mood disorder, this later was changed to Schizoaffective disorder, bipolar type, Dx with GAD, received tx for this as a child, Childhood dx of Learning disability was in Special education classes from K- 9th grade.   Previous medications: Prozac (childhood, was not helpful eluded to this worsening behavior as a child), Zyprexa (unsure but believes he may have still had significant irritability on this), Risperdal, Depakote (beneficial)  Past Medical History:  Past Medical History:  Diagnosis Date   Bipolar 1 disorder (HCC)    under control   History of kidney stones    Sinus drainage    seasonal-spring    Past Surgical History:  Procedure Laterality Date   CIRCUMCISION  age 89   MULTIPLE EXTRACTIONS WITH ALVEOLOPLASTY N/A 12/13/2018   Procedure: MULTIPLE EXTRACTION WITH ALVEOLOPLASTY;  Surgeon: Ocie DoyneJensen, Scott, DDS;  Location: [facility];  Service: Oral Surgery;  Laterality: N/A;   NEPHROLITHOTOMY Right 12/23/2013   Procedure: RIGHT PERCUTANEOUS NEPHROLITHOTOMY WITH SURGEON ACCESS;  Surgeon: Crist FatBenjamin W Herrick, MD;  Location: [facility];  Service: Urology;  Laterality: Right;  Family Psychiatric History:  1 son: Born premature, has Autism and ADHD 1 daughter: Autism Father: Etoh use disorder  Family History:  Family History  Problem Relation Age of Onset   Lung cancer Mother    Heart attack Father     Social History:  Social History   Socioeconomic History   Marital status: Single    Spouse name: Not on file   Number of children: Not on file   Years of education: Not on file   Highest education level: Not on file  Occupational History   Not  on file  Tobacco Use   Smoking status: Every Day    Packs/day: 1.00    Types: Cigarettes   Smokeless tobacco: Never  Vaping Use   Vaping Use: Never used  Substance and Sexual Activity   Alcohol use: Not Currently    Comment: occ   Drug use: No   Sexual activity: Not on file  Other Topics Concern   Not on file  Social History Narrative   Not on file   Social Determinants of Health   Financial Resource Strain: Not on file  Food Insecurity: Not on file  Transportation Needs: Not on file  Physical Activity: Not on file  Stress: Not on file  Social Connections: Not on file    Allergies: No Known Allergies  Metabolic Disorder Labs: Lab Results  Component Value Date   HGBA1C 6.1 (H) 12/07/2021   No results found for: "PROLACTIN" Lab Results  Component Value Date   CHOL 227 (H) 12/07/2021   TRIG 123 12/07/2021   HDL 34 (L) 12/07/2021   CHOLHDL 6.7 (H) 12/07/2021   LDLCALC 170 (H) 12/07/2021   Lab Results  Component Value Date   TSH 0.943 12/07/2021    Therapeutic Level Labs: No results found for: "LITHIUM" Lab Results  Component Value Date   VALPROATE <10 (L) 05/27/2019   VALPROATE <10 (L) 08/30/2018   No results found for: "CBMZ"  Current Medications: Current Outpatient Medications  Medication Sig Dispense Refill   divalproex (DEPAKOTE ER) 500 MG 24 hr tablet Take 1 tablet (500 mg total) by mouth at bedtime. 30 tablet 2   nicotine (NICODERM CQ) 7 mg/24hr patch Place 1 patch (7 mg total) onto the skin daily. 28 patch 0   Cetirizine HCl 10 MG CAPS Take 1 capsule (10 mg total) by mouth daily. 30 capsule 0   fluticasone (FLONASE) 50 MCG/ACT nasal spray Place 1-2 sprays into both nostrils daily as needed for up to 7 days for allergies. 16 g 0   hydrOXYzine (ATARAX) 10 MG tablet Take 1 tablet (10 mg total) by mouth 3 (three) times daily as needed. 90 tablet 3   risperiDONE (RISPERDAL) 0.5 MG tablet Take 1 tablet (0.5 mg total) by mouth daily. 30 tablet 2    risperiDONE (RISPERDAL) 1 MG tablet Take 1 tablet (1 mg total) by mouth at bedtime. 30 tablet 2   simvastatin (ZOCOR) 10 MG tablet Take 1 tablet (10 mg total) by mouth every evening. 30 tablet 11   No current facility-administered medications for this visit.     Musculoskeletal: Strength & Muscle Tone: within normal limits Gait & Station: normal Patient leans: N/A  Psychiatric Specialty Exam: Review of Systems  Psychiatric/Behavioral:  Positive for agitation. Negative for dysphoric mood, hallucinations, sleep disturbance and suicidal ideas.     Blood pressure 126/81, pulse 78, weight 219 lb (99.3 kg), SpO2 98 %.Body mass index is 28.89 kg/m.  General Appearance: Casual  Eye Contact:  Good  Speech:  Clear and Coherent  Volume:  Normal  Mood:  Euthymic  Affect:  Appropriate  Thought Process:  Coherent  Orientation:  Full (Time, Place, and Person)  Thought Content: Logical   Suicidal Thoughts:  No  Homicidal Thoughts:  No  Memory:  Immediate;   Good Recent;   Good  Judgement:  Good  Insight:  Fair  Psychomotor Activity:  Normal  Concentration:  Concentration: Good  Recall:  NA  Fund of Knowledge: Good  Language: Good  Akathisia:  Negative  Handed:    AIMS (if indicated): not done  Assets:  Communication Skills Desire for Improvement Housing Intimacy Resilience Social Support  ADL's:  Intact  Cognition: WNL  Sleep:  Good   Screenings: AIMS    Flowsheet Row Clinical Support from 10/24/2021 in [facility]  AIMS Total Score 0      GAD-7    Flowsheet Row Clinical Support from 10/24/2021 in [facility] Video Visit from [facility]/22/2021 [facility] Office Visit from 11/01/2020 in [facility]  Total GAD-7 Score 7 8 11       PHQ2-9    Flowsheet Row Clinical Support from 10/24/2021 in [facility] Video Visit from 02/22/2021 in  [facility] Office Visit from 11/01/2020 in [facility]  PHQ-2 Total Score 3 2 2   PHQ-9 Total Score 11 5 10       Flowsheet Row ED from 11/23/2020 in [facility] ED from 05/27/2019 in [facility] ED from 08/30/2018 in [facility] EMERGENCY DEPARTMENT  C-SSRS RISK CATEGORY No Risk Error: Q6 is Yes, you must answer 7 High Risk        Assessment and Plan:   Nykeem Citro is a 46 yo patient with PPH of Schizoaffective disorder, bipolar type and GAD.  Based on assessment today, patient appears to be very concerned about his irritability and impulsivity.  Although patient continues to have auditory hallucinations he appears less bothered by them and able to manage them by his impulse control has been an issue in the past, patient was previously medicated on Depakote.  Would like to start patient on a lower dose of Depakote and will get labs when he follows up with therapy next week.  We will continue patient's Risperdal however, may consolidate patient's Risperdal at next appointment once patient has started Depakote.  In regards to nicotine, patient was open to starting patches.  Patient appears to be less concerned with anxiety at this time  Schizoaffective disorder, bipolar type - Continue Risperdal 0.5 mg in the a.m. and 1 mg nightly - Start Depakote 500 mg nightly  GAD - Continue hydroxyzine 10 mg 3 times daily  Cigarette nicotine dependence without complication - Discontinue Nicorette gum - Start nicotine patch 7 mg daily  Follow-up in approximately 1 month - Patient will get Depakote level on 12/12  Collaboration of Care: Collaboration of Care:   Patient/Guardian was advised Release of Information must be obtained prior to any record release in order to collaborate their care with an outside provider. Patient/Guardian was advised if they have not already  done so to contact the registration department to sign all necessary forms in order for Randall [name] to release information regarding their care.   Consent: Patient/Guardian gives verbal consent for treatment and assignment of benefits for services provided  during this visit. Patient/Guardian expressed understanding and agreed to proceed.   PGY-3 Bobbye Morton, MD 12/30/2021, 3:34 PM

## 2022-01-03 ENCOUNTER — Ambulatory Visit (INDEPENDENT_AMBULATORY_CARE_PROVIDER_SITE_OTHER): Payer: Medicare HMO | Admitting: Clinical

## 2022-01-03 ENCOUNTER — Other Ambulatory Visit (HOSPITAL_COMMUNITY): Payer: Self-pay | Admitting: Student in an Organized Health Care Education/Training Program

## 2022-01-03 ENCOUNTER — Other Ambulatory Visit (HOSPITAL_COMMUNITY): Payer: Medicare HMO

## 2022-01-03 DIAGNOSIS — F250 Schizoaffective disorder, bipolar type: Secondary | ICD-10-CM | POA: Diagnosis not present

## 2022-01-03 NOTE — Progress Notes (Signed)
Comprehensive Clinical Assessment (CCA) Note  01/03/2022 Randall [name] [id number]  Chief Complaint:  Chief Complaint  Patient presents with   Anxiety   Depression   Visit Diagnosis:  Schizoaffective disorder, bipolar type   Interpretive Summary:    Client is a 46 year old male presenting to [facility] for outpatient therapy services. Client is currently followed by a [facility] psychiatrist for the treatment of Schizoaffective disorder. Client reported he was previously in treatment with [facility] behavioral services. Client reported medication management is working well to manage majority and severity of his symptoms. Client reported he continues to have difficulty with mild depression as it relates to his self esteem and irritability. Client reported getting into verbal arguments with his girlfriend and also socially mor ethan he'd like. Client reported impulse control can be a issue. Client reported occasionally having auditory hallucinations which are manageable. Client reported his main stressor is finding his own housing. Client reported he also wants to work on figuring out what from his past/ childhood are negatively affecting the relationship with his girlfriend, family and interactions with others in general. Client reported he stopped the use of alcohol and marijuana since 2013. Client presented oriented times five, appropriately dressed, and friendly. Client denied hallucinations, delusions, suicidal and homicidal ideations. 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 Client is currently being  treated for schizoaffective disorder, bipolar type.  Current Symptoms/Problems: Client reported auditory hallucinations, mild depression, anxiety, and irritability  Patient Reported Schizophrenia/Schizoaffective Diagnosis in Past: Yes  Strengths: vountarily engaging in outpatient treatment  Preferences: counseling and medication management  Abilities: vocalize symptoms and treatment plan goals  Type of Services Patient Feels are Needed: therapy and psychiatry  Initial Clinical Notes/Concerns: No data recorded  Mental Health Symptoms Depression:   Change in energy/activity   Duration of Depressive symptoms:  Greater than two weeks   Mania:   None   Anxiety:    Tension   Psychosis:   None   Duration of Psychotic symptoms: No data recorded  Trauma:   None   Obsessions:   None   Compulsions:   None   Inattention:   None   Hyperactivity/Impulsivity:   None   Oppositional/Defiant Behaviors:   None   Emotional Irregularity:   None   Other Mood/Personality Symptoms:  No data recorded   Mental Status Exam Appearance and self-care  Stature:   Tall   Weight:   Average weight   Clothing:   Casual   Grooming:   Normal   Cosmetic use:   Age appropriate   Posture/gait:   Normal   Motor activity:   Not Remarkable   Sensorium  Attention:   Normal   Concentration:   Normal   Orientation:   X5   Recall/memory:   Normal   Affect and Mood  Affect:   Congruent   Mood:   Euthymic   Relating  Eye contact:   Normal   Facial expression:   Responsive   Attitude toward examiner:   Cooperative   Thought and Language  Speech flow:  Clear and Coherent   Thought content:   Appropriate to Mood and Circumstances  Preoccupation:   None   Hallucinations:   None   Organization:  No data recorded  [employer] of Knowledge:   Good   Intelligence:   Average   Abstraction:   Normal   Judgement:   Good    Reality Testing:   Adequate   Insight:   Good   Decision Making:   Normal   Social Functioning  Social Maturity:   Responsible   Social Judgement:   Normal   Stress  Stressors:   Housing; Relationship   Coping Ability:   Resilient   Skill Deficits:   Self-control   Supports:   Friends/Service system     Religion: Religion/Spirituality Are You A Religious Person?: Yes  Leisure/Recreation: Leisure / Recreation Do You Have Hobbies?: No  Exercise/Diet: Exercise/Diet Do You Exercise?: No Have You Gained or Lost A Significant Amount of Weight in the Past Six Months?: No Do You Follow a Special Diet?: No Do You Have Any Trouble Sleeping?: Yes   CCA Employment/Education Employment/Work Situation: Employment / Work Situation Employment Situation: On disability Why is Patient on Disability: mental health  Education: Education Did Garment/textile technologist From [employer]?: Yes   CCA Family/Childhood History Family and Relationship History: Family history Marital status: Long term relationship Long term relationship, how long?: 6 years What types of issues is patient dealing with in the relationship?: Client reported she wants the client to find his own place to live Does patient have children?: Yes How many children?: 3 How is patient's relationship with their children?: Client reported he has 1 girl and 2 boys  Childhood History:  Childhood History By whom was/is the patient raised?: Both parents Additional childhood history information: Client reported he is from [place] raised by both parents. Patient's description of current relationship with people who raised him/her: Client reported his mother passed in 2017 and his father passed in 1993 Does patient have siblings?: Yes Number of Siblings: 6 Description of patient's current relationship with siblings: Client reported he does not have a good relationship with his siblings because they hold his past  against him. Did patient suffer any verbal/emotional/physical/sexual abuse as a child?: No Did patient suffer from severe childhood neglect?: No Has patient ever been sexually abused/assaulted/raped as an adolescent or adult?: No Was the patient ever a victim of a crime or a disaster?: No Witnessed domestic violence?: No Has patient been affected by domestic violence as an adult?: No  Child/Adolescent Assessment:     CCA Substance Use Alcohol/Drug Use: Alcohol / Drug Use History of alcohol / drug use?: No history of alcohol / drug abuse                         ASAM's:  Six Dimensions of Multidimensional Assessment  Dimension 1:  Acute Intoxication and/or Withdrawal Potential:      Dimension 2:  Biomedical Conditions and Complications:      Dimension 3:  Emotional, Behavioral, or Cognitive Conditions and Complications:     Dimension 4:  Readiness to Change:     Dimension 5:  Relapse, Continued use, or Continued Problem Potential:     Dimension 6:  Recovery/Living Environment:     ASAM Severity Score:    ASAM Recommended Level of Treatment:     Substance use Disorder (SUD)    Recommendations for Services/Supports/Treatments: Recommendations for Services/Supports/Treatments Recommendations For Services/Supports/Treatments: Medication Management, Individual Therapy  DSM5 Diagnoses: Patient Active Problem List   Diagnosis Date  Noted   Routine adult health maintenance 12/07/2021   Nephrolithiasis 12/23/2013    Patient Centered Plan: Patient is on the following Treatment Plan(s):  Depression   Referrals to Alternative Service(s): Referred to Alternative Service(s):   Place:   Date:   Time:    Referred to Alternative Service(s):   Place:   Date:   Time:    Referred to Alternative Service(s):   Place:   Date:   Time:    Referred to Alternative Service(s):   Place:   Date:   Time:      Collaboration of Care: Referral or follow-up with counselor/therapist AEB  [facility]  Patient/Guardian was advised Release of Information must be obtained prior to any record release in order to collaborate their care with an outside provider. Patient/Guardian was advised if they have not already done so to contact the registration department to sign all necessary forms in order for [place] to release information regarding their care.   Consent: Patient/Guardian gives verbal consent for treatment and assignment of benefits for services provided during this visit. Patient/Guardian expressed understanding and agreed to proceed.   Neena Rhymes Corney Knighton, LCSW

## 2022-01-10 LAB — SPECIMEN STATUS REPORT

## 2022-01-10 LAB — VALPROIC ACID LEVEL

## 2022-01-10 NOTE — Progress Notes (Signed)
This patient came and got the blood drawn.

## 2022-01-30 ENCOUNTER — Ambulatory Visit (INDEPENDENT_AMBULATORY_CARE_PROVIDER_SITE_OTHER): Payer: Medicare HMO | Admitting: Clinical

## 2022-01-30 ENCOUNTER — Encounter (HOSPITAL_COMMUNITY): Payer: Self-pay

## 2022-01-30 DIAGNOSIS — F250 Schizoaffective disorder, bipolar type: Secondary | ICD-10-CM
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
## 2022-02-10 ENCOUNTER — Ambulatory Visit (INDEPENDENT_AMBULATORY_CARE_PROVIDER_SITE_OTHER): Payer: Medicare HMO | Admitting: Student in an Organized Health Care Education/Training Program

## 2022-02-10 ENCOUNTER — Encounter (HOSPITAL_COMMUNITY): Payer: Self-pay | Admitting: Student in an Organized Health Care Education/Training Program

## 2022-02-10 VITALS — BP 121/78 | HR 62 | Resp 20 | Wt 226.0 lb

## 2022-02-10 DIAGNOSIS — F411 Generalized anxiety disorder: Secondary | ICD-10-CM

## 2022-02-10 DIAGNOSIS — F17210 Nicotine dependence, cigarettes, uncomplicated: Secondary | ICD-10-CM

## 2022-02-10 DIAGNOSIS — F250 Schizoaffective disorder, bipolar type: Secondary | ICD-10-CM

## 2022-02-10 MED ORDER — NICOTINE 21 MG/24HR TD PT24: 21.0000 mg | MEDICATED_PATCH | Freq: Every day | TRANSDERMAL | 2 refills | Status: DC

## 2022-02-10 MED ORDER — HYDROXYZINE HCL 25 MG PO TABS: 25.0000 mg | ORAL_TABLET | Freq: Three times a day (TID) | ORAL | 1 refills | Status: DC | PRN

## 2022-02-10 MED ORDER — RISPERIDONE 0.5 MG PO TABS: 0.5000 mg | ORAL_TABLET | Freq: Every day | ORAL | 2 refills | Status: DC

## 2022-02-10 MED ORDER — RISPERIDONE 1 MG PO TABS: 1.0000 mg | ORAL_TABLET | Freq: Every day | ORAL | 2 refills | Status: DC

## 2022-02-10 MED ORDER — DIVALPROEX SODIUM ER 500 MG PO TB24: 500.0000 mg | ORAL_TABLET | Freq: Every day | ORAL | 2 refills | Status: DC

## 2022-02-10 NOTE — Progress Notes (Signed)
BH MD/PA/NP OP Progress Note  02/10/2022 4:13 PM Randall [name]  MRN:  [id number]  Chief Complaint:  Chief Complaint  Patient presents with   Follow-up   HPI: Randall [name] is a 47 yo patient with PPH of schizoaffective disorder, bipolar type and GAD seen for follow-up for medication management.  He is currently being managed on risperidone 0.5mg  in the day and 1 mg nightly, Depakote ER 500 mg nightly and hydroxyzine 10 mg 3 times daily for anxiety.  Patient denies having adverse side effects from current medication regimen.  Patient reports he has been compliant with his medications.  Patient is also very happy to report that he now has his own housing.  Provider reviewed with patient his labs, unfortunately Depakote levels did not result.  Patient reports that he is already seeing his PCP and he was started on statin therapy.  Also expressed to patient that he does currently qualify for prediabetes, patient endorsed to be working on lifestyle changes.  Patient reports he feels his medications are good where they are.  Patient reports he is not having as many auditory hallucinations and the ones that he does have on occasion are comforting.  Patient reports they have significantly decreased even since last seen this provider.  Patient reports his mood is "okay" he is sleeping well.  Patient also reports a stable appetite and denies SI, HI and visual hallucinations.  Patient reports his anxiety has decreased and he is only requiring approximately 20 mg of hydroxyzine in the a.m. and p.m.  Patient reports that he is feeling more prepared to work part-time.  Patient reports that he feels that he can control his feelings and is less irritable overall.  Patient endorses on a scale of 1-10 with 10 being in the best place possible he is currently at a 7 and his readiness to be around others.  Patient endorses in the past he would often isolate himself for his safety and others due to fear of lashing out;  however he feels that he is better able to manage relationships with others.  Patient reports that he is currently smoking 1 pack/day and that the 7 mg nicotine patch did not help.  Patient is interested in increasing to 21 mg.  Patient denies having any feelings of akathisia, irritability or impulsivity. Visit Diagnosis:    ICD-10-CM   1. Schizoaffective disorder, bipolar type (HCC)  F25.0 divalproex (DEPAKOTE ER) 500 MG 24 hr tablet    risperiDONE (RISPERDAL) 1 MG tablet    risperiDONE (RISPERDAL) 0.5 MG tablet    Valproic Acid level    2. Cigarette nicotine dependence without complication  F17.210 nicotine (NICODERM CQ) 21 mg/24hr patch    3. Generalized anxiety disorder  F41.1 hydrOXYzine (ATARAX) 25 MG tablet      Past Psychiatric History:   INPT: Denies Oupt: Since childhood, was also seen by [facility] as adult Dx: In [place] initially diagnosed with undetermined mood disorder, this later was changed to Schizoaffective disorder, bipolar type, Dx with GAD, received tx for this as a child, Childhood dx of Learning disability was in Special education classes from K- 9th grade.   Previous medications: Prozac (childhood, was not helpful eluded to this worsening behavior as a child), Zyprexa (unsure but believes he may have still had significant irritability on this), Risperdal, Depakote (beneficial)  Last visit: 12/2021-patient endorses significant irritability and concern regarding this.  Patient started back on Depakote ER at 500 mg.  Patient continued on his Risperdal 0.5  mg in the morning and 1 mg nightly as well as hydroxyzine 10 mg 3 times daily as needed.  Past Medical History:  Past Medical History:  Diagnosis Date   Bipolar 1 disorder ([place])    under control   History of kidney stones    Sinus drainage    seasonal-spring    Past Surgical History:  Procedure Laterality Date   CIRCUMCISION  age 56   MULTIPLE EXTRACTIONS WITH ALVEOLOPLASTY N/A 12/13/2018   Procedure:  MULTIPLE EXTRACTION WITH ALVEOLOPLASTY;  Surgeon: Diona Browner, DDS;  Location: [facility];  Service: Oral Surgery;  Laterality: N/A;   NEPHROLITHOTOMY Right 12/23/2013   Procedure: RIGHT PERCUTANEOUS NEPHROLITHOTOMY WITH SURGEON ACCESS;  Surgeon: Ardis Hughs, MD;  Location: [facility];  Service: Urology;  Laterality: Right;    Family Psychiatric History:   1 son: Born premature, has Autism and ADHD 1 daughter: Autism Father: Etoh use disorder  Family History:  Family History  Problem Relation Age of Onset   Lung cancer Mother    Heart attack Father     Social History:  Social History   Socioeconomic History   Marital status: Single    Spouse name: Not on file   Number of children: Not on file   Years of education: Not on file   Highest education level: Not on file  Occupational History   Not on file  Tobacco Use   Smoking status: Every Day    Packs/day: 1.00    Types: Cigarettes   Smokeless tobacco: Never  Vaping Use   Vaping Use: Never used  Substance and Sexual Activity   Alcohol use: Not Currently    Comment: occ   Drug use: No   Sexual activity: Not on file  Other Topics Concern   Not on file  Social History Narrative   Not on file   Social Determinants of Health   Financial Resource Strain: Not on file  Food Insecurity: Not on file  Transportation Needs: Not on file  Physical Activity: Not on file  Stress: Not on file  Social Connections: Not on file    Allergies: No Known Allergies  Metabolic Disorder Labs: Lab Results  Component Value Date   HGBA1C 6.1 (H) 12/07/2021   No results found for: "PROLACTIN" Lab Results  Component Value Date   CHOL 227 (H) 12/07/2021   TRIG 123 12/07/2021   HDL 34 (L) 12/07/2021   CHOLHDL 6.7 (H) 12/07/2021   LDLCALC 170 (H) 12/07/2021   Lab Results  Component Value Date   TSH 0.943 12/07/2021    Therapeutic Level Labs: No results found for: "LITHIUM" Lab Results  Component Value Date   VALPROATE  CANCELED 01/03/2022   VALPROATE <10 (L) 05/27/2019   No results found for: "CBMZ"  Current Medications: Current Outpatient Medications  Medication Sig Dispense Refill   Cetirizine HCl 10 MG CAPS Take 1 capsule (10 mg total) by mouth daily. 30 capsule 0   divalproex (DEPAKOTE ER) 500 MG 24 hr tablet Take 1 tablet (500 mg total) by mouth at bedtime. 30 tablet 2   fluticasone (FLONASE) 50 MCG/ACT nasal spray Place 1-2 sprays into both nostrils daily as needed for up to 7 days for allergies. 16 g 0   hydrOXYzine (ATARAX) 25 MG tablet Take 1 tablet (25 mg total) by mouth 3 (three) times daily as needed. 90 tablet 1   nicotine (NICODERM CQ) 21 mg/24hr patch Place 1 patch (21 mg total) onto the skin daily. 28 patch 2  risperiDONE (RISPERDAL) 0.5 MG tablet Take 1 tablet (0.5 mg total) by mouth daily. 30 tablet 2   risperiDONE (RISPERDAL) 1 MG tablet Take 1 tablet (1 mg total) by mouth at bedtime. 30 tablet 2   simvastatin (ZOCOR) 10 MG tablet Take 1 tablet (10 mg total) by mouth every evening. 30 tablet 11   No current facility-administered medications for this visit.     Musculoskeletal: Strength & Muscle Tone: within normal limits Gait & Station: normal Patient leans: N/A  Psychiatric Specialty Exam: Review of Systems  Psychiatric/Behavioral:  Negative for agitation, behavioral problems, dysphoric mood, sleep disturbance and suicidal ideas.     Blood pressure 121/78, pulse 62, resp. rate 20, weight 226 lb (102.5 kg), SpO2 100 %.Body mass index is 29.82 kg/m.  General Appearance: Casual  Eye Contact:  Good  Speech:  Clear and Coherent  Volume:  Normal  Mood:  Euthymic  Affect:  Appropriate  Thought Process:  Coherent  Orientation:  Full (Time, Place, and Person)  Thought Content: Logical   Suicidal Thoughts:  No  Homicidal Thoughts:  No  Memory:  Immediate;   Good Recent;   Good  Judgement:  Good  Insight:  Good  Psychomotor Activity:  Normal  Concentration:  Concentration:  Good  Recall:  NA  Fund of Knowledge: Good  Language: Good  Akathisia:  No  Handed:    AIMS (if indicated): not done  Assets:  Communication Skills Desire for Improvement Housing Resilience Social Support  ADL's:  Intact  Cognition: WNL  Sleep:  Good   Screenings: AIMS    Flowsheet Row Clinical Support from 10/24/2021 in [facility] Total Score 0      GAD-7    Flowsheet Row Clinical Support from 10/24/2021 in [facility] Video Visit from 02/22/2021 in [facility] Office Visit from 11/01/2020 in [facility]  Total GAD-7 Score 7 8 11       PHQ2-9    Flowsheet Row Counselor from 01/03/2022 in [facility] from 10/24/2021 in [facility] Video Visit from 02/22/2021 in [facility] Office Visit from 11/01/2020 in [facility]  PHQ-2 Total Score 1 3 2 2   PHQ-9 Total Score 2 11 5 10       Flowsheet Row Counselor from 01/03/2022 in [facility] ED from 11/23/2020 in [facility] ED from 05/27/2019 in [facility] Department at [place] No Risk No Risk Error: Q6 is Yes, you must answer 7        Assessment and Plan:  Randall [name] is a 47 yo patient with PPH of Schizoaffective disorder, bipolar type and GAD.   Based on assessment today patient appears to be doing well on current medication regimen.  Will increase patient's hydroxyzine as he is currently taking approximately 20 mg as needed for anxiety, but patient does not appear to meet this every day and overall feels his anxiety is well-managed.  Patient's insight and judgment appear to have significantly improved.  Do not suspect that patient's Depakote level is toxic however, would like to get  these to baseline with patient on current dose.  Patient reports he is willing to come back again on therapy appointment today, to reattempt labs.  Although patient continues to report the occasional auditory hallucination this appears to be  significantly decreased from initial assessment.  Patient's previous patch was obviously not working as he currently smokes much more than the equivalent of a 7 mg nicotine patch will increase to 21mg  as patient is still interested in tobacco cessation.  Schizoaffective disorder, bipolar type - Continue Risperdal 0.5 mg in the a.m. and 1 mg nightly - Continue Depakote ER 500 mg nightly   GAD - Increase hydroxyzine  to 25 mg 3 times daily   Cigarette nicotine dependence without complication - Discontinue nicotine patch 7 mg daily - Start nicotine patch 21 mg daily    - Patient will get Depakote level on 1/30  Follow-up in approximately 3 months  Collaboration of Care: Collaboration of Care:   Patient/Guardian was advised Release of Information must be obtained prior to any record release in order to collaborate their care with an outside provider. Patient/Guardian was advised if they have not already done so to contact the registration department to sign all necessary forms in order for 2/30 to release information regarding their care.   Consent: Patient/Guardian gives verbal consent for treatment and assignment of benefits for services provided during this visit. Patient/Guardian expressed understanding and agreed to proceed.   PGY-3 [place], MD 02/10/2022, 4:13 PM

## 2022-02-14 ENCOUNTER — Telehealth: Payer: Self-pay | Admitting: Nurse Practitioner

## 2022-02-14 NOTE — Telephone Encounter (Signed)
Left message for patient to call back and schedule Medicare Annual Wellness Visit (AWV) in office.   If not able to come in office, please offer to do virtually or by telephone.  Left office number and my jabber [phone].  AWVI eligible as of 05/23/2012  Please schedule at anytime with Nurse Health Advisor.

## 2022-02-21 ENCOUNTER — Other Ambulatory Visit (HOSPITAL_COMMUNITY): Payer: Medicare HMO

## 2022-02-21 ENCOUNTER — Other Ambulatory Visit (HOSPITAL_COMMUNITY): Payer: Self-pay | Admitting: Student in an Organized Health Care Education/Training Program

## 2022-02-21 ENCOUNTER — Ambulatory Visit (INDEPENDENT_AMBULATORY_CARE_PROVIDER_SITE_OTHER): Payer: Medicare HMO | Admitting: Clinical

## 2022-02-21 DIAGNOSIS — F250 Schizoaffective disorder, bipolar type: Secondary | ICD-10-CM

## 2022-02-21 NOTE — Progress Notes (Signed)
   THERAPIST PROGRESS NOTE  Session Time: 30 minutes  Participation Level: Active  Behavioral Response: CasualAlertEuthymic  Type of Therapy: Individual Therapy  Treatment Goals addressed: client will learn and implement assertive communication skills for addressing irritability in an appropriate respectful manner evidenced by self report 1x per session  ProgressTowards Goals: Progressing  Interventions: CBT and Supportive  Summary:  Randall [name] is a 47 y.o. male who presents for the scheduled appointment oriented times five, appropriately dressed and friendly. Client denied hallucinations and delusions. Client reported on today he is doing well. Client reported with his relationship he decided to give space between them. Client reported they will only see each other on the weekends. Client reported otherwise his youngest son is about to graduate highschool and he is doing well. Client reported he is proud of his son and his wanting to further his education. Client reported he is in good spirits and doing well in his new place. Client reported he tends to stay to himself but will talk to his supportive friends. Client reported his goal for this year is to find a part time job. Client reported he has continued to reflect over his childhood. Client reported his parents fussed a lot but were never physical. Client reported he did not know he was going to have the psych disorder he currently has and his upbringing played a role in that. Client reported he is continuing to work on improving his diet but is feeling well. Evidence of progress towards goal:  client reported medication compliance 7 days per week.    Suicidal/Homicidal: Nowithout intent/plan  Therapist Response:  Therapist began the appointment asking the client how he has been doing since last seen. Therapist used CBT to engage using active listening and positive emotional support. Therapist used CBT to engage and ask the  client how his mood has been and contributing factors. Therapist used CBT to engage and positively reinforce the clients use of boundaries and self care. Therapist used CBT ask the client to identify his progress with frequency of use with coping skills with continued practice in his daily activity.    Therapist assigned the client homework to practice self care. Client was scheduled for next appointment.   Plan: Return again in 3 weeks.  Diagnosis: schizoaffective disorder, bipolar type  Collaboration of Care: Patient refused AEB none requested by the client.  Patient/Guardian was advised Release of Information must be obtained prior to any record release in order to collaborate their care with an outside provider. Patient/Guardian was advised if they have not already done so to contact the registration department to sign all necessary forms in order for [place] to release information regarding their care.   Consent: Patient/Guardian gives verbal consent for treatment and assignment of benefits for services provided during this visit. Patient/Guardian expressed understanding and agreed to proceed.   [place], LCSW 02/21/2022

## 2022-02-23 LAB — VALPROIC ACID LEVEL: Valproic Acid Lvl: 52 ug/mL (ref 50–100)

## 2022-03-02 ENCOUNTER — Ambulatory Visit (INDEPENDENT_AMBULATORY_CARE_PROVIDER_SITE_OTHER): Payer: Medicare HMO

## 2022-03-02 VITALS — Ht 73.0 in | Wt 218.0 lb

## 2022-03-02 DIAGNOSIS — Z0000 Encounter for general adult medical examination without abnormal findings: Secondary | ICD-10-CM

## 2022-03-02 NOTE — Progress Notes (Addendum)
Subjective:   Randall [name] is a 47 y.o. male who presents for Medicare Annual/Subsequent preventive examination.  Review of Systems    Virtual Visit via Telephone Note  I connected with  Randall [name] on 03/02/22 at  8:15 AM EST by telephone and verified that I am speaking with the correct person using two identifiers.  Location: Patient: Home Provider: Office Persons participating in the virtual visit: patient/Nurse Health Advisor   I discussed the limitations, risks, security and privacy concerns of performing an evaluation and management service by telephone and the availability of in person appointments. The patient expressed understanding and agreed to proceed.  Interactive audio and video telecommunications were attempted between this nurse and patient, however failed, due to patient having technical difficulties OR patient did not have access to video capability.  We continued and completed visit with audio only.  Some vital signs may be absent or patient reported.   Criselda Peaches, LPN  Cardiac Risk Factors include: advanced age (>61mn, >>75women);male gender     Objective:    Today's Vitals   03/02/22 0820  Weight: 218 lb (98.9 kg)  Height: 6' 1"$  (1.854 m)   Body mass index is 28.76 kg/m.     03/02/2022    8:28 AM 05/27/2019    2:52 PM 05/27/2019    8:44 AM 12/13/2018    9:34 AM 08/30/2018    7:30 PM 12/23/2013   11:59 AM 12/22/2013    9:42 AM  Advanced Directives  Does Patient Have a Medical Advance Directive? No No No No No No No  Would patient like information on creating a medical advance directive? No - Patient declined No - Patient declined No - Patient declined No - Patient declined No - Patient declined No - patient declined information Yes - Educational materials given    Current Medications (verified) Outpatient Encounter Medications as of 03/02/2022  Medication Sig   Cetirizine HCl 10 MG CAPS Take 1 capsule (10 mg total) by mouth daily.    divalproex (DEPAKOTE ER) 500 MG 24 hr tablet Take 1 tablet (500 mg total) by mouth at bedtime.   fluticasone (FLONASE) 50 MCG/ACT nasal spray Place 1-2 sprays into both nostrils daily as needed for up to 7 days for allergies.   hydrOXYzine (ATARAX) 25 MG tablet Take 1 tablet (25 mg total) by mouth 3 (three) times daily as needed.   nicotine (NICODERM CQ) 21 mg/24hr patch Place 1 patch (21 mg total) onto the skin daily.   risperiDONE (RISPERDAL) 0.5 MG tablet Take 1 tablet (0.5 mg total) by mouth daily.   risperiDONE (RISPERDAL) 1 MG tablet Take 1 tablet (1 mg total) by mouth at bedtime.   simvastatin (ZOCOR) 10 MG tablet Take 1 tablet (10 mg total) by mouth every evening.   No facility-administered encounter medications on file as of 03/02/2022.    Allergies (verified) Patient has no known allergies.   History: Past Medical History:  Diagnosis Date   Bipolar 1 disorder ([place]    under control   History of kidney stones    Sinus drainage    seasonal-spring   Past Surgical History:  Procedure Laterality Date   CIRCUMCISION  age 89  MULTIPLE EXTRACTIONS WITH ALVEOLOPLASTY N/A 12/13/2018   Procedure: MULTIPLE EXTRACTION WITH ALVEOLOPLASTY;  Surgeon: JDiona Browner DDS;  [place] [facility]  Service: Oral Surgery;  Laterality: N/A;   NEPHROLITHOTOMY Right 12/23/2013   Procedure: RIGHT PERCUTANEOUS NEPHROLITHOTOMY WITH SURGEON ACCESS;  Surgeon: BViona Gilmore  Louis Meckel, MD;  Location: [facility];  Service: Urology;  Laterality: Right;   Family History  Problem Relation Age of Onset   Lung cancer Mother    Heart attack Father    Social History   Socioeconomic History   Marital status: Single    Spouse name: Not on file   Number of children: Not on file   Years of education: Not on file   Highest education level: Not on file  Occupational History   Not on file  Tobacco Use   Smoking status: Every Day    Packs/day: 1.00    Types: Cigarettes   Smokeless tobacco: Never  Vaping Use   Vaping  Use: Never used  Substance and Sexual Activity   Alcohol use: Not Currently    Comment: occ   Drug use: No   Sexual activity: Not on file  Other Topics Concern   Not on file  Social History Narrative   Not on file   Social Determinants of Health   Financial Resource Strain: Low Risk  (03/02/2022)   Overall Financial Resource Strain (CARDIA)    Difficulty of Paying Living Expenses: Not hard at all  Food Insecurity: No Food Insecurity (03/02/2022)   Hunger Vital Sign    Worried About Running Out of Food in the Last Year: Never true    [place] in the Last Year: Never true  Transportation Needs: No Transportation Needs (03/02/2022)   PRAPARE - Hydrologist (Medical): No    Lack of Transportation (Non-Medical): No  Physical Activity: Inactive (03/02/2022)   Exercise Vital Sign    Days of Exercise per Week: 0 days    Minutes of Exercise per Session: 0 min  Stress: No Stress Concern Present (03/02/2022)   [place]    Feeling of Stress : Not at all  Social Connections: Moderately Integrated (03/02/2022)   Social Connection and Isolation Panel [NHANES]    Frequency of Communication with Friends and Family: More than three times a week    Frequency of Social Gatherings with Friends and Family: More than three times a week    Attends Religious Services: More than 4 times per year    Active Member of [employer] or Organizations: Yes    Attends Music therapist: More than 4 times per year    Marital Status: Never married    Tobacco Counseling Ready to quit: Yes Counseling given: Yes   Clinical Intake:  Pre-visit preparation completed: No  Pain : No/denies pain     BMI - recorded: 28.76 Nutritional Status: BMI 25 -29 Overweight Nutritional Risks: None Diabetes: Yes CBG done?: No Did pt. bring in CBG monitor from home?: No  How often do you need to have someone help you  when you read instructions, pamphlets, or other written materials from your doctor or pharmacy?: 1 - Never  Diabetic?  Pre Diabetic  Interpreter Needed?: No  Information entered by :: Rolene Arbour LPN   Activities of Daily Living    03/02/2022    8:26 AM  In your present state of health, do you have any difficulty performing the following activities:  Hearing? 0  Vision? 0  Difficulty concentrating or making decisions? 0  Walking or climbing stairs? 0  Dressing or bathing? 0  Doing errands, shopping? 0  Preparing Food and eating ? N  Using the Toilet? N  In the past six months, have you  accidently leaked urine? N  Do you have problems with loss of bowel control? N  Managing your Medications? N  Managing your Finances? N  Housekeeping or managing your Housekeeping? N    Patient Care Team: Fenton Foy, NP as PCP - General (Pulmonary Disease)  Indicate any recent Medical Services you may have received from other than [facility] providers in the past year (date may be approximate).     Assessment:   This is a routine wellness examination for Randall [name].  Hearing/Vision screen Hearing Screening - Comments:: Denies hearing difficulties   Vision Screening - Comments:: Wears rx glasses - up to date with routine eye exams with  [facility]  Dietary issues and exercise activities discussed: Exercise limited by: None identified   Goals Addressed               This Visit's Progress     Stay Healthy (pt-stated)        I want to control not being a diabetic.       Depression Screen    03/02/2022    8:26 AM 01/03/2022    9:25 AM 10/24/2021   10:12 AM 02/22/2021    9:31 AM 11/01/2020    9:55 AM  PHQ 2/9 Scores  PHQ - 2 Score 0      PHQ- 9 Score       Exception Documentation          Information is confidential and restricted. Go to Review Flowsheets to unlock data.    Fall Risk    03/02/2022    8:27 AM  Fall Risk   Falls in the past year? 0  Number falls in past  yr: 0  Injury with Fall? 0  Risk for fall due to : No Fall Risks  Follow up Falls prevention discussed    [place]:  Any stairs in or around the home? Yes  If so, are there any without handrails? No  Home free of loose throw rugs in walkways, pet beds, electrical cords, etc? Yes  Adequate lighting in your home to reduce risk of falls? Yes   ASSISTIVE DEVICES UTILIZED TO PREVENT FALLS:  Life alert? No  Use of a cane, walker or w/c? No  Grab bars in the bathroom? Yes Shower chair or bench in shower? No  Elevated toilet seat or a handicapped toilet? No   TIMED UP AND GO:  Was the test performed? No . Audio Visit   Cognitive Function:        03/02/2022    8:28 AM  6CIT Screen  What Year? 0 points  What month? 0 points  What time? 0 points  Count back from 20 0 points  Months in reverse 0 points  Repeat phrase 0 points  Total Score 0 points    Immunizations Immunization History  Administered Date(s) Administered   Pfizer Fall 2023 Covid-19 Vaccine 17mo thru 429yr 11/18/2021    TDAP status: Due, Education has been provided regarding the importance of this vaccine. Advised may receive this vaccine at local pharmacy or Health Dept. Aware to provide a copy of the vaccination record if obtained from local pharmacy or Health Dept. Verbalized acceptance and understanding.  Flu Vaccine status: Up to date    Covid-19 vaccine status: Completed vaccines  Qualifies for Shingles Vaccine? No   Zostavax completed No   Shingrix Completed?: No.    Education has been provided regarding the importance of this vaccine.  Patient has been advised to call insurance company to determine out of pocket expense if they have not yet received this vaccine. Advised may also receive vaccine at local pharmacy or Health Dept. Verbalized acceptance and understanding.  Screening Tests Health Maintenance  Topic Date Due   DTaP/Tdap/Td (1 - Tdap) Never done    COVID-19 Vaccine (1) 03/18/2022 (Originally 11/18/2021)   INFLUENZA VACCINE  04/23/2022 (Originally 08/23/2021)   Hepatitis C Screening  03/03/2023 (Originally 03/11/1995)   HIV Screening  03/03/2023 (Originally 03/10/1992)   Medicare Annual Wellness (AWV)  03/03/2023   HPV VACCINES  Aged Out    Health Maintenance  Health Maintenance Due  Topic Date Due   DTaP/Tdap/Td (1 - Tdap) Never done      Lung Cancer Screening: (Low Dose CT Chest recommended if Age 13-80 years, 30 pack-year currently smoking OR have quit w/in 15years.) does qualify.   Lung Cancer Screening Referral: Deferred  Additional Screening:  Hepatitis C Screening: does qualify; Completed Deferred  Vision Screening: Recommended annual ophthalmology exams for early detection of glaucoma and other disorders of the eye. Is the patient up to date with their annual eye exam?  Yes  Who is the provider or what is the name of the office in which the patient attends annual eye exams? [facility] If pt is not established with a provider, would they like to be referred to a provider to establish care? No .   Dental Screening: Recommended annual dental exams for proper oral hygiene  Community Resource Referral / Chronic Care Management:  CRR required this visit?  No   CCM required this visit?  No      Plan:     I have personally reviewed and noted the following in the patient's chart:   Medical and social history Use of alcohol, tobacco or illicit drugs  Current medications and supplements including opioid prescriptions. Patient is not currently taking opioid prescriptions. Functional ability and status Nutritional status Physical activity Advanced directives List of other physicians Hospitalizations, surgeries, and ER visits in previous 12 months Vitals Screenings to include cognitive, depression, and falls Referrals and appointments  In addition, I have reviewed and discussed with patient certain preventive  protocols, quality metrics, and best practice recommendations. A written personalized care plan for preventive services as well as general preventive health recommendations were provided to patient.     Criselda Peaches, LPN   [id number]   Nurse Notes: Patient due Hep-C and HIV Screening

## 2022-03-02 NOTE — Patient Instructions (Addendum)
Randall [name] , Thank you for taking time to come for your Medicare Wellness Visit. I appreciate your ongoing commitment to your health goals. Please review the following plan we discussed and let me know if I can assist you in the future.   These are the goals we discussed:  Goals       Stay Healthy (pt-stated)      I want to control not being a diabetic.        This is a list of the screening recommended for you and due dates:  Health Maintenance  Topic Date Due   DTaP/Tdap/Td vaccine (1 - Tdap) Never done   COVID-19 Vaccine (1) 03/18/2022*   Flu Shot  04/23/2022*   Hepatitis C Screening: USPSTF Recommendation to screen - Ages 18-79 yo.  03/03/2023*   HIV Screening  03/03/2023*   Medicare Annual Wellness Visit  03/03/2023   HPV Vaccine  Aged Out  *Topic was postponed. The date shown is not the original due date.    Advanced directives: Advance directive discussed with you today. Even though you declined this today, please call our office should you change your mind, and we can give you the proper paperwork for you to fill out.   Conditions/risks identified: None  Next appointment: Follow up in one year for your annual wellness visit    Preventive Care 40-64 Years, Male Preventive care refers to lifestyle choices and visits with your health care provider that can promote health and wellness. What does preventive care include? A yearly physical exam. This is also called an annual well check. Dental exams once or twice a year. Routine eye exams. Ask your health care provider how often you should have your eyes checked. Personal lifestyle choices, including: Daily care of your teeth and gums. Regular physical activity. Eating a healthy diet. Avoiding tobacco and drug use. Limiting alcohol use. Practicing safe sex. Taking low-dose aspirin every day starting at age 97. What happens during an annual well check? The services and screenings done by your health care provider during  your annual well check will depend on your age, overall health, lifestyle risk factors, and family history of disease. Counseling  Your health care provider may ask you questions about your: Alcohol use. Tobacco use. Drug use. Emotional well-being. Home and relationship well-being. Sexual activity. Eating habits. Work and work Statistician. Screening  You may have the following tests or measurements: Height, weight, and BMI. Blood pressure. Lipid and cholesterol levels. These may be checked every 5 years, or more frequently if you are over 15 years old. Skin check. Lung cancer screening. You may have this screening every year starting at age 75 if you have a 30-pack-year history of smoking and currently smoke or have quit within the past 15 years. Fecal occult blood test (FOBT) of the stool. You may have this test every year starting at age 73. Flexible sigmoidoscopy or colonoscopy. You may have a sigmoidoscopy every 5 years or a colonoscopy every 10 years starting at age 21. Prostate cancer screening. Recommendations will vary depending on your family history and other risks. Hepatitis C blood test. Hepatitis B blood test. Sexually transmitted disease (STD) testing. Diabetes screening. This is done by checking your blood sugar (glucose) after you have not eaten for a while (fasting). You may have this done every 1-3 years. Discuss your test results, treatment options, and if necessary, the need for more tests with your health care provider. Vaccines  Your health care provider may recommend certain  vaccines, such as: Influenza vaccine. This is recommended every year. Tetanus, diphtheria, and acellular pertussis (Tdap, Td) vaccine. You may need a Td booster every 10 years. Zoster vaccine. You may need this after age 37. Pneumococcal 13-valent conjugate (PCV13) vaccine. You may need this if you have certain conditions and have not been vaccinated. Pneumococcal polysaccharide (PPSV23)  vaccine. You may need one or two doses if you smoke cigarettes or if you have certain conditions. Talk to your health care provider about which screenings and vaccines you need and how often you need them. This information is not intended to replace advice given to you by your health care provider. Make sure you discuss any questions you have with your health care provider. Document Released: 02/05/2015 Document Revised: 09/29/2015 Document Reviewed: 11/10/2014 Elsevier Interactive Patient Education  2017 [place] Prevention in the Home Falls can cause injuries. They can happen to people of all ages. There are many things you can do to make your home safe and to help prevent falls. What can I do on the outside of my home? Regularly fix the edges of walkways and driveways and fix any cracks. Remove anything that might make you trip as you walk through a door, such as a raised step or threshold. Trim any bushes or trees on the path to your home. Use bright outdoor lighting. Clear any walking paths of anything that might make someone trip, such as rocks or tools. Regularly check to see if handrails are loose or broken. Make sure that both sides of any steps have handrails. Any raised decks and porches should have guardrails on the edges. Have any leaves, snow, or ice cleared regularly. Use sand or salt on walking paths during winter. Clean up any spills in your garage right away. This includes oil or grease spills. What can I do in the bathroom? Use night lights. Install grab bars by the toilet and in the tub and shower. Do not use towel bars as grab bars. Use non-skid mats or decals in the tub or shower. If you need to sit down in the shower, use a plastic, non-slip stool. Keep the floor dry. Clean up any water that spills on the floor as soon as it happens. Remove soap buildup in the tub or shower regularly. Attach bath mats securely with double-sided non-slip rug tape. Do not  have throw rugs and other things on the floor that can make you trip. What can I do in the bedroom? Use night lights. Make sure that you have a light by your bed that is easy to reach. Do not use any sheets or blankets that are too big for your bed. They should not hang down onto the floor. Have a firm chair that has side arms. You can use this for support while you get dressed. Do not have throw rugs and other things on the floor that can make you trip. What can I do in the kitchen? Clean up any spills right away. Avoid walking on wet floors. Keep items that you use a lot in easy-to-reach places. If you need to reach something above you, use a strong step stool that has a grab bar. Keep electrical cords out of the way. Do not use floor polish or wax that makes floors slippery. If you must use wax, use non-skid floor wax. Do not have throw rugs and other things on the floor that can make you trip. What can I do with my stairs? Do not leave  any items on the stairs. Make sure that there are handrails on both sides of the stairs and use them. Fix handrails that are broken or loose. Make sure that handrails are as long as the stairways. Check any carpeting to make sure that it is firmly attached to the stairs. Fix any carpet that is loose or worn. Avoid having throw rugs at the top or bottom of the stairs. If you do have throw rugs, attach them to the floor with carpet tape. Make sure that you have a light switch at the top of the stairs and the bottom of the stairs. If you do not have them, ask someone to add them for you. What else can I do to help prevent falls? Wear shoes that: Do not have high heels. Have rubber bottoms. Are comfortable and fit you well. Are closed at the toe. Do not wear sandals. If you use a stepladder: Make sure that it is fully opened. Do not climb a closed stepladder. Make sure that both sides of the stepladder are locked into place. Ask someone to hold it for  you, if possible. Clearly mark and make sure that you can see: Any grab bars or handrails. First and last steps. Where the edge of each step is. Use tools that help you move around (mobility aids) if they are needed. These include: Canes. Walkers. Scooters. Crutches. Turn on the lights when you go into a dark area. Replace any light bulbs as soon as they burn out. Set up your furniture so you have a clear path. Avoid moving your furniture around. If any of your floors are uneven, fix them. If there are any pets around you, be aware of where they are. Review your medicines with your doctor. Some medicines can make you feel dizzy. This can increase your chance of falling. Ask your doctor what other things that you can do to help prevent falls. This information is not intended to replace advice given to you by your health care provider. Make sure you discuss any questions you have with your health care provider. Document Released: 11/05/2008 Document Revised: 06/17/2015 Document Reviewed: 02/13/2014 Elsevier Interactive Patient Education  2017 [employer].

## 2022-03-27 ENCOUNTER — Ambulatory Visit (INDEPENDENT_AMBULATORY_CARE_PROVIDER_SITE_OTHER): Payer: Medicare HMO | Admitting: Clinical

## 2022-03-27 DIAGNOSIS — F250 Schizoaffective disorder, bipolar type: Secondary | ICD-10-CM

## 2022-03-27 NOTE — Progress Notes (Signed)
THERAPIST PROGRESS NOTE  Session Time: 25 min  Participation Level: Active  Behavioral Response: CasualAlertEuthymic  Type of Therapy: Individual Therapy  Treatment Goals addressed: client will identify situations, thoughts and feelings that trigger internal anger, and/or aggressive actions as evidenced by self report 1x per session  ProgressTowards Goals: Progressing  Interventions: CBT and Supportive  Summary:  Randall [name] is a 47 y.o. male who presents for the scheduled appointment oriented times 5 appropriately dressed and friendly.  Client denied hallucinations and delusions. Client reported on today he is doing pretty well.  Client reported he is currently in a pretty decent space with his girlfriend.  Client reported he encouraged her to go out of town to visit her mother.  Client reported lately he has been talking to his pastor about looking forward to the future.  Client reported his pastor has encouraged him to not worry about what other people think about him.  Client reported that relates to his relationship with his family as they do not get along very well.  Client reported 3 of his older brothers have always spoke to him about wanting him to do better but it comes from a place of projecting that they are better than him then of being positive support.  Client reported they tell him he should go back to school which she is interested in but they are not understanding with his situation of being on disability that he cannot just up in do things at this point of the moment.  Client reported when he was growing up at the time when his mother and grandmother passed away is when he had a decline.  Client reported he did have his.  Of being involved in activities that were not good.  Client reported he has excepted the grief and move past it but he does have times when he thinks of his mother although they had a few relationship when he was coming up.  Client reported the words  of his brothers does not linger and has had at sometimes but overall he tries to manage feelings of sadness or anxiety related to that.  Client reported otherwise he is doing well with medication management and is keeping up with all of his doctors appointments. Evidence of progress towards goal:  client reported medication compliance 7 days per week. Client reported 1 negative thought pattern related to his family that contributes to depression.   Suicidal/Homicidal: Nowithout intent/plan  Therapist Response:  Therapist began the appointment asking the client how he has been doing since last seen. Therapist used CBT to engage using active listening and positive emotional support. Therapist used CBT to engage and ask the client open ended questions about the source of his negative thoughts and feelings. Therapist used CBT to normalize the client emotions within reason and discuss practicing self esteem by challenging intrusive thoughts. Therapist used CBT to discuss boundaries. Therapist used CBT ask the client to identify his progress with frequency of use with coping skills with continued practice in his daily activity.    Therapist assigned the client homework to practice self care.    Plan: Return again in 3 weeks.  Diagnosis: schizoaffective disorder, bipolar type  Collaboration of Care: Patient refused AEB none requested by the client.  Patient/Guardian was advised Release of Information must be obtained prior to any record release in order to collaborate their care with an outside provider. Patient/Guardian was advised if they have not already done so to contact the registration  department to sign all necessary forms in order for [place] to release information regarding their care.   Consent: Patient/Guardian gives verbal consent for treatment and assignment of benefits for services provided during this visit. Patient/Guardian expressed understanding and agreed to proceed.   [place], LCSW 03/27/2022

## 2022-04-10 ENCOUNTER — Encounter (HOSPITAL_COMMUNITY): Payer: Self-pay

## 2022-04-10 ENCOUNTER — Ambulatory Visit (HOSPITAL_COMMUNITY): Payer: Medicaid Other | Admitting: Clinical

## 2022-05-01 ENCOUNTER — Ambulatory Visit (INDEPENDENT_AMBULATORY_CARE_PROVIDER_SITE_OTHER): Payer: Medicare HMO | Admitting: Clinical

## 2022-05-01 ENCOUNTER — Encounter (HOSPITAL_COMMUNITY): Payer: Self-pay

## 2022-05-01 DIAGNOSIS — F250 Schizoaffective disorder, bipolar type: Secondary | ICD-10-CM | POA: Diagnosis not present

## 2022-05-01 NOTE — Progress Notes (Signed)
   THERAPIST PROGRESS NOTE  Session Time: 25 minutes  Participation Level: Active  Behavioral Response: CasualAlertEuthymic  Type of Therapy: Individual Therapy  Treatment Goals addressed: client will identify situations, thoughts, and feelings that trigger internal anger, or aggressive actions as evidenced by self report 1x per session  ProgressTowards Goals: Progressing  Interventions: CBT and Supportive  Summary:  Randall [name] is a 47 y.o. male who presents for the scheduled appointment oriented times five, appropriately dressed, and friendly. Client denied hallucinations and delusions. Client reported on today he is doing pretty well. Client reported he has been wanting/ working on how to mend his family dynamic. Client reported he is good with his children but wants to be close with his siblings. Client reported the family has been talking about doing a family reunion this summer. Client reported it wouldn't feel the same because his parents are no longer living. Client reported his sister who was with their mom when she passed still struggles with grief to this day and goes to counseling for it. Client reported he does need to work on negative thinking about being worried of what others think of him. Client reported it applies to family and others in general. Client reported his pastor has been telling hi not to worry about the opinion of others. Evidence of progress towards goal:  client reported 1 negative cognitive pattern about himself that negatively impacts his self esteem.   Suicidal/Homicidal: Nowithout intent/plan  Therapist Response:  Therapist began the appointment asking the client how he has been doing since last seen. Therapist used CBT to engage using active listening and positive emotional support. Therapist used CBT to engage and have the client to identify his thought patterns and/ or beliefs that are barrier to improvement in family dynamic and his own  functioning. Therapist used CBT to normalize his emotional response and discuss self esteem thought processing and giving himself proper exposure to anxiety situations. Therapist used CBT ask the client to identify his progress with frequency of use with coping skills with continued practice in his daily activity.    Therapist assigned the client homework to practice reframing negative thoughts about himself.   Plan: Return again in 3 weeks.  Diagnosis: schizoaffective disorder, bipolar type  Collaboration of Care: Patient refused AEB none requested by the client.  Patient/Guardian was advised Release of Information must be obtained prior to any record release in order to collaborate their care with an outside provider. Patient/Guardian was advised if they have not already done so to contact the registration department to sign all necessary forms in order for [place] to release information regarding their care.   Consent: Patient/Guardian gives verbal consent for treatment and assignment of benefits for services provided during this visit. Patient/Guardian expressed understanding and agreed to proceed.   Neena Rhymes Zamariya Neal, LCSW 05/01/2022

## 2022-05-08 ENCOUNTER — Ambulatory Visit (INDEPENDENT_AMBULATORY_CARE_PROVIDER_SITE_OTHER): Payer: Medicare HMO | Admitting: Student in an Organized Health Care Education/Training Program

## 2022-05-08 DIAGNOSIS — F250 Schizoaffective disorder, bipolar type: Secondary | ICD-10-CM | POA: Diagnosis not present

## 2022-05-08 MED ORDER — RISPERIDONE 1 MG PO TABS: 1.0000 mg | ORAL_TABLET | Freq: Every day | ORAL | 2 refills | Status: DC

## 2022-05-08 MED ORDER — DIVALPROEX SODIUM ER 500 MG PO TB24: 500.0000 mg | ORAL_TABLET | Freq: Every day | ORAL | 2 refills | Status: DC

## 2022-05-08 NOTE — Progress Notes (Signed)
BH MD/PA/NP OP Progress Note  05/08/2022 8:54 AM Randall [name]  MRN:  [id number]  Chief Complaint: No chief complaint on file.  HPI: Randall [name] is a 47 yo patient with PPH of schizoaffective disorder, bipolar type and GAD seen for follow-up for medication management.  He is currently being managed on risperidone 0.5mg  in the day and 1 mg nightly, Depakote ER 500 mg nightly and hydroxyzine 25 mg 3 times daily for anxiety.  Patient denies having adverse side effects from current medication regimen.   Patient is worried about his eating habits and prediabetes with his medication. Patient reports that he is still working on his irritability and trying to use skills from therapy. Patient reports that he doe still feel constantly on edge but does not think he will lash out at people. He reports that he does feel I control of his worrying. Patient reports that he is sleep is going well and work is going well. He reports that now that he has his own place things are going well and his youngest son will be graduating from school this year. Patient reports that he has been eating more and is constantly hungry.  Patient reports a likely manic episode about 3 years ago. He reports that he was sober, and for about 1 week he just kept picking up shifts and then at home he would never sleep. Patient reports he was much more irritable during this time.   Patient denies SI, HI, and AVH. He does feel like he is a bit sleepy during the day from the meds.   Visit Diagnosis: No diagnosis found.  Past Psychiatric History:  INPT:  1x for mental breakdown Oupt: Since childhood, was also seen by [facility] as adult Dx: In [place] initially diagnosed with undetermined mood disorder, this later was changed to Schizoaffective disorder, bipolar type, Dx with GAD, received tx for this as a child, Childhood dx of Learning disability was in Special education classes from K- 9th grade.   Previous medications: Prozac  (childhood, was not helpful eluded to this worsening behavior as a child), Zyprexa (unsure but believes he may have still had significant irritability on this), Risperdal, Depakote (beneficial)   Last visit:  01/2022- Patient appeared to be doing fairly well, hydroxyzine was increased to 25mg  due to endorsing when he does take the medication he was taking approx 20mg  at a time, interested in tobacco cessation , started on NRT patch at 21mg  daily, depakote level 52, 02/21/2022 12/2021-patient endorses significant irritability and concern regarding this.  Patient started back on Depakote ER at 500 mg.  Patient continued on his Risperdal 0.5 mg in the morning and 1 mg nightly as well as hydroxyzine 10 mg 3 times daily as needed.  Past Medical History:  Past Medical History:  Diagnosis Date   Bipolar 1 disorder (HCC)    under control   History of kidney stones    Sinus drainage    seasonal-spring    Past Surgical History:  Procedure Laterality Date   CIRCUMCISION  age 29   MULTIPLE EXTRACTIONS WITH ALVEOLOPLASTY N/A 12/13/2018   Procedure: MULTIPLE EXTRACTION WITH ALVEOLOPLASTY;  Surgeon: Ocie Doyne, DDS;  Location: [facility] OR;  Service: Oral Surgery;  Laterality: N/A;   NEPHROLITHOTOMY Right 12/23/2013   Procedure: RIGHT PERCUTANEOUS NEPHROLITHOTOMY WITH SURGEON ACCESS;  Surgeon: Crist Fat, MD;  Location: [facility] ORS;  Service: Urology;  Laterality: Right;    Family Psychiatric History: 1 son: Born premature, has Autism and ADHD 1 daughter:  Autism Father: Etoh use disorder  Family History:  Family History  Problem Relation Age of Onset   Lung cancer Mother    Heart attack Father     Social History:  Social History   Socioeconomic History   Marital status: Single    Spouse name: Not on file   Number of children: Not on file   Years of education: Not on file   Highest education level: Not on file  Occupational History   Not on file  Tobacco Use   Smoking status: Every Day     Packs/day: 1    Types: Cigarettes   Smokeless tobacco: Never  Vaping Use   Vaping Use: Never used  Substance and Sexual Activity   Alcohol use: Not Currently    Comment: occ   Drug use: No   Sexual activity: Not on file  Other Topics Concern   Not on file  Social History Narrative   Not on file   Social Determinants of Health   Financial Resource Strain: Low Risk  (03/02/2022)   Overall Financial Resource Strain (CARDIA)    Difficulty of Paying Living Expenses: Not hard at all  Food Insecurity: No Food Insecurity (03/02/2022)   Hunger Vital Sign    Worried About Running Out of Food in the Last Year: Never true    Ran Out of Food in the Last Year: Never true  Transportation Needs: No Transportation Needs (03/02/2022)   PRAPARE - Administrator, Civil Service (Medical): No    Lack of Transportation (Non-Medical): No  Physical Activity: Inactive (03/02/2022)   Exercise Vital Sign    Days of Exercise per Week: 0 days    Minutes of Exercise per Session: 0 min  Stress: No Stress Concern Present (03/02/2022)   [employer] of Occupational Health - Occupational Stress Questionnaire    Feeling of Stress : Not at all  Social Connections: Moderately Integrated (03/02/2022)   Social Connection and Isolation Panel [NHANES]    Frequency of Communication with Friends and Family: More than three times a week    Frequency of Social Gatherings with Friends and Family: More than three times a week    Attends Religious Services: More than 4 times per year    Active Member of Clubs or Organizations: Yes    Attends Engineer, structural: More than 4 times per year    Marital Status: Never married    Allergies: No Known Allergies  Metabolic Disorder Labs: Lab Results  Component Value Date   HGBA1C 6.1 (H) 12/07/2021   No results found for: "PROLACTIN" Lab Results  Component Value Date   CHOL 227 (H) 12/07/2021   TRIG 123 12/07/2021   HDL 34 (L) 12/07/2021   CHOLHDL 6.7  (H) 12/07/2021   LDLCALC 170 (H) 12/07/2021   Lab Results  Component Value Date   TSH 0.943 12/07/2021    Therapeutic Level Labs: No results found for: "LITHIUM" Lab Results  Component Value Date   VALPROATE 52 02/21/2022   VALPROATE CANCELED 01/03/2022   No results found for: "CBMZ"  Current Medications: Current Outpatient Medications  Medication Sig Dispense Refill   Cetirizine HCl 10 MG CAPS Take 1 capsule (10 mg total) by mouth daily. 30 capsule 0   divalproex (DEPAKOTE ER) 500 MG 24 hr tablet Take 1 tablet (500 mg total) by mouth at bedtime. 30 tablet 2   fluticasone (FLONASE) 50 MCG/ACT nasal spray Place 1-2 sprays into both nostrils daily as needed for  up to 7 days for allergies. 16 g 0   hydrOXYzine (ATARAX) 25 MG tablet Take 1 tablet (25 mg total) by mouth 3 (three) times daily as needed. 90 tablet 1   nicotine (NICODERM CQ) 21 mg/24hr patch Place 1 patch (21 mg total) onto the skin daily. 28 patch 2   risperiDONE (RISPERDAL) 0.5 MG tablet Take 1 tablet (0.5 mg total) by mouth daily. 30 tablet 2   risperiDONE (RISPERDAL) 1 MG tablet Take 1 tablet (1 mg total) by mouth at bedtime. 30 tablet 2   simvastatin (ZOCOR) 10 MG tablet Take 1 tablet (10 mg total) by mouth every evening. 30 tablet 11   No current facility-administered medications for this visit.     Musculoskeletal: Strength & Muscle Tone: within normal limits Gait & Station: normal Patient leans: N/A  Psychiatric Specialty Exam: Review of Systems  Psychiatric/Behavioral:  Negative for decreased concentration, dysphoric mood, hallucinations, sleep disturbance and suicidal ideas. The patient is not nervous/anxious.     There were no vitals taken for this visit.There is no height or weight on file to calculate BMI.  General Appearance: Casual  Eye Contact:  Good  Speech:  Clear and Coherent  Volume:  Normal  Mood:  Euthymic  Affect:  Appropriate  Thought Process:  Coherent  Orientation:  Full (Time,  Place, and Person)  Thought Content: Logical   Suicidal Thoughts:  No  Homicidal Thoughts:  No  Memory:  Immediate;   Good Recent;   Good  Judgement:  Good  Insight:  Good  Psychomotor Activity:  Normal  Concentration:  Concentration: Fair  Recall:  NA  Fund of Knowledge: Good  Language: Good  Akathisia:  none  Handed:    AIMS (if indicated): not done  Assets:  Communication Skills Desire for Improvement Intimacy Resilience Social Support Transportation  ADL's:  Intact  Cognition: WNL  Sleep:  Good   Screenings: AIMS    Flowsheet Row Clinical Support from 10/24/2021 in [facility]  AIMS Total Score 0      GAD-7    Flowsheet Row Clinical Support from 10/24/2021 in [facility] Video Visit from 02/22/2021 in [facility] Office Visit from 11/01/2020 in [facility]  Total GAD-7 Score PHQ2-9    Flowsheet Row Clinical Support from 03/02/2022 in [facility] Counselor from 01[facility]03/2022 in [facility] Clinical Support from 10/24/2021 in [facility] Video Visit from 02/22/2021 in [facility] Office Visit from 11/01/2020 in [facility]  PHQ-2 Total Score 0 PHQ-9 Total Score -- Flowsheet Row Counselor from 01/03/2022 in [facility] ED from 11/23/2020 in [facility] ED from 05/27/2019 in [facility] Emergency Department at [facility]  C-SSRS RISK CATEGORY No Risk No Risk Error: Q6 is Yes, you must answer 7        Assessment and Plan: Patient appears to be fairly stable in regards to the irritability and impulsivity however, he endorses valid concerns about increased appetite and blood sugar.  Patient endorses a bit of anxiety about his  prediabetes and upcoming hemoglobin A1c monitoring at PCP.  Patient did report that he does like has been eating more and his male partner would endorse this  if she were here.  He also briefly endorsed that he does feel some sedation during the day due to his medication and will discontinue daytime risperidone in an effort to mitigate metabolic syndrome and decreased risk for daytime sedation.  Schizoaffective disorder, bipolar type  -dc' Risperdone 0.5mg  daily, hope for decrease in appetite and day time sedation.  - Continue Risperidone 1mg  QHS - Continue Depakote ER 500 mg nightly  Depakote level appropriate at 52  GAD - Continue hydroxyzine 25 mg 3 times daily  Cigarette nicotine dependence without complication - Follow-up at next appointment, patient appears to have continued use at the time  Collaboration of Care: Collaboration of Care:   Patient/Guardian was advised Release of Information must be obtained prior to any record release in order to collaborate their care with an outside provider. Patient/Guardian was advised if they have not already done so to contact the registration department to sign all necessary forms in order for [place] to release information regarding their care.   Consent: Patient/Guardian gives verbal consent for treatment and assignment of benefits for services provided during this visit. Patient/Guardian expressed understanding and agreed to proceed.   PGY-3 Bobbye Morton, MD 05/08/2022, 8:54 AM

## 2022-06-02 ENCOUNTER — Ambulatory Visit (HOSPITAL_COMMUNITY)
Admission: EM | Admit: 2022-06-02 | Discharge: 2022-06-02 | Disposition: A | Discharge status: Home / Self Care | Payer: Medicare HMO | Attending: Emergency Medicine | Admitting: Emergency Medicine

## 2022-06-02 ENCOUNTER — Encounter (HOSPITAL_COMMUNITY): Payer: Self-pay

## 2022-06-02 DIAGNOSIS — Z1152 Encounter for screening for COVID-19: Secondary | ICD-10-CM | POA: Insufficient documentation

## 2022-06-02 DIAGNOSIS — R197 Diarrhea, unspecified: Secondary | ICD-10-CM | POA: Diagnosis present

## 2022-06-02 MED ORDER — LOPERAMIDE HCL 2 MG PO CAPS: 2.0000 mg | ORAL_CAPSULE | Freq: Four times a day (QID) | ORAL | 0 refills | Status: DC | PRN

## 2022-06-02 NOTE — Discharge Instructions (Addendum)
Overall your physical exam is reassuring.  For the diarrhea, I suggest a bland diet.  You can trial the Pepto-Bismol or the Imodium.  Please ensure you are staying hydrated with at least 64 ounces of water daily.  I also suggest adding an electrolyte solution like Pedialyte or Gatorade.   Please return to clinic or seek follow-up care if you have no improvement in your diarrhea over the next week.  Please seek immediate care if you develop loss of consciousness, or unable to hold down food or fluids, or have any new concerning symptoms.

## 2022-06-02 NOTE — ED Provider Notes (Signed)
[facility]    CSN: [id number] Arrival date & time: 06/02/22  1115      History   Chief Complaint Chief Complaint  Patient presents with   Diarrhea   Emesis    HPI Randall [name] is a 47 y.o. male.   Patient presents to clinic for concerns of diarrhea since Monday.  Reports whenever he eats he to has diarrhea soon after this.  He denies any body aches, fevers, shortness of breath, chest pain, abdominal pain, nausea or vomiting.  He does want COVID-19 testing.  Has not tried any medication for diarrhea, was clinic pick up some Pepto-Bismol after he left the urgent care.  The history is provided by the patient and medical records.  Diarrhea Associated symptoms: no abdominal pain, no arthralgias, no chills, no fever and no vomiting   Emesis Associated symptoms: diarrhea   Associated symptoms: no abdominal pain, no arthralgias, no chills and no fever     Past Medical History:  Diagnosis Date   Bipolar 1 disorder (HCC)    under control   History of kidney stones    Sinus drainage    seasonal-spring    Patient Active Problem List   Diagnosis Date Noted   Routine adult health maintenance 12/07/2021    Past Surgical History:  Procedure Laterality Date   CIRCUMCISION  age 70   MULTIPLE EXTRACTIONS WITH ALVEOLOPLASTY N/A 12/13/2018   Procedure: MULTIPLE EXTRACTION WITH ALVEOLOPLASTY;  Surgeon: Ocie Doyne, DDS;  Location: [facility];  Service: Oral Surgery;  Laterality: N/A;   NEPHROLITHOTOMY Right 12/23/2013   Procedure: RIGHT PERCUTANEOUS NEPHROLITHOTOMY WITH SURGEON ACCESS;  Surgeon: Crist Fat, MD;  Location: [facility];  Service: Urology;  Laterality: Right;       Home Medications    Prior to Admission medications   Medication Sig Start Date End Date Taking? Authorizing Provider  Cetirizine HCl 10 MG CAPS Take 1 capsule (10 mg total) by mouth daily. 11/23/20  Yes Raspet, Erin K, PA-C  divalproex (DEPAKOTE ER) 500 MG 24 hr tablet Take 1 tablet (500  mg total) by mouth at bedtime. 05/08/22  Yes Bobbye Morton, MD  loperamide (IMODIUM) 2 MG capsule Take 1 capsule (2 mg total) by mouth 4 (four) times daily as needed for diarrhea or loose stools. 06/02/22  Yes Rinaldo Ratel, [place], FNP  nicotine (NICODERM CQ) 21 mg/24hr patch Place 1 patch (21 mg total) onto the skin daily. 02/10/22  Yes Bobbye Morton, MD  risperiDONE (RISPERDAL) 1 MG tablet Take 1 tablet (1 mg total) by mouth at bedtime. 05/08/22 05/08/23 Yes Bobbye Morton, MD  simvastatin (ZOCOR) 10 MG tablet Take 1 tablet (10 mg total) by mouth every evening. 12/08/21 12/08/22 Yes Ivonne Andrew, NP  fluticasone (FLONASE) 50 MCG/ACT nasal spray Place 1-2 sprays into both nostrils daily as needed for up to 7 days for allergies. 11/23/20 11/30/20  Raspet, Noberto Retort, PA-C  hydrOXYzine (ATARAX) 25 MG tablet Take 1 tablet (25 mg total) by mouth 3 (three) times daily as needed. 02/10/22   Bobbye Morton, MD    Family History Family History  Problem Relation Age of Onset   Lung cancer Mother    Heart attack Father     Social History Social History   Tobacco Use   Smoking status: Every Day    Packs/day: 1    Types: Cigarettes   Smokeless tobacco: Never  Vaping Use   Vaping Use: Never used  Substance Use Topics  Alcohol use: Not Currently    Comment: occ   Drug use: No     Allergies   Patient has no known allergies.   Review of Systems Review of Systems  Constitutional:  Negative for chills and fever.  Respiratory:  Negative for shortness of breath.   Cardiovascular:  Negative for chest pain.  Gastrointestinal:  Positive for diarrhea. Negative for abdominal pain, nausea and vomiting.  Musculoskeletal:  Negative for arthralgias.     Physical Exam Triage Vital Signs ED Triage Vitals  Enc Vitals Group     BP 06/02/22 1229 127/88     Pulse Rate 06/02/22 1232 82     Resp 06/02/22 1229 16     Temp 06/02/22 1232 98.7 F (37.1 C)     Temp Source 06/02/22 1232 Oral     SpO2  06/02/22 1229 98 %     Weight --      Height --      Head Circumference --      Peak Flow --      Pain Score --      Pain Loc --      Pain Edu? --      Excl. in GC? --    No data found.  Updated Vital Signs BP 127/88 (BP Location: Left Arm)   Pulse 82   Temp 98.7 F (37.1 C) (Oral)   Resp 16   SpO2 98%   Visual Acuity Right Eye Distance:   Left Eye Distance:   Bilateral Distance:    Right Eye Near:   Left Eye Near:    Bilateral Near:     Physical Exam Vitals and nursing note reviewed.  Constitutional:      Appearance: Normal appearance.  HENT:     Head: Normocephalic and atraumatic.     Right Ear: External ear normal.     Left Ear: External ear normal.     Nose: Nose normal.     Mouth/Throat:     Mouth: Mucous membranes are moist.  Eyes:     Conjunctiva/sclera: Conjunctivae normal.  Cardiovascular:     Rate and Rhythm: Normal rate and regular rhythm.     Heart sounds: Normal heart sounds. No murmur heard. Pulmonary:     Effort: Pulmonary effort is normal. No respiratory distress.     Breath sounds: Normal breath sounds.  Abdominal:     General: Abdomen is flat. Bowel sounds are normal. There is no distension.     Palpations: Abdomen is soft. There is no mass.     Tenderness: There is abdominal tenderness in the right upper quadrant. There is no guarding or rebound.     Hernia: No hernia is present.  Musculoskeletal:        General: No swelling. Normal range of motion.  Skin:    General: Skin is warm and dry.  Neurological:     General: No focal deficit present.     Mental Status: He is alert and oriented to person, place, and time.  Psychiatric:        Mood and Affect: Mood normal.        Behavior: Behavior normal. Behavior is cooperative.      UC Treatments / Results  Labs (all labs ordered are listed, but only abnormal results are displayed) Labs Reviewed  SARS CORONAVIRUS 2 (TAT 6-24 HRS)    EKG   Radiology No results  found.  Procedures Procedures (including critical care time)  Medications Ordered in UC Medications -  No data to display  Initial Impression / Assessment and Plan / UC Course  I have reviewed the triage vital signs and the nursing notes.  Pertinent labs & imaging results that were available during my care of the patient were reviewed by me and considered in my medical decision making (see chart for details).  Vitals and triage reviewed, patient is hemodynamically stable.  Ongoing diarrhea since Monday, one episode today.  Minor tenderness to right upper quadrant, without rebound or guarding.  Afebrile without tachycardia, low concern for acute abdomen.  Will trial Imodium and Pepto, encouraged bland diet.  Plan of care, follow-up and return precautions reviewed, no questions at this time.     Final Clinical Impressions(s) / UC Diagnoses   Final diagnoses:  Diarrhea, unspecified type     Discharge Instructions      Overall your physical exam is reassuring.  For the diarrhea, I suggest a bland diet.  You can trial the Pepto-Bismol or the Imodium.  Please ensure you are staying hydrated with at least 64 ounces of water daily.  I also suggest adding an electrolyte solution like Pedialyte or Gatorade.   Please return to clinic or seek follow-up care if you have no improvement in your diarrhea over the next week.  Please seek immediate care if you develop loss of consciousness, or unable to hold down food or fluids, or have any new concerning symptoms.      ED Prescriptions     Medication Sig Dispense Auth. Provider   loperamide (IMODIUM) 2 MG capsule Take 1 capsule (2 mg total) by mouth 4 (four) times daily as needed for diarrhea or loose stools. 12 capsule Lynniah Janoski, [place], [place]      PDMP not reviewed this encounter.   Kaoru Benda, [place] N, [place] 06/02/22 [phone]

## 2022-06-02 NOTE — ED Triage Notes (Signed)
3-day history of diarrhea and vomiting. Pt stated he wants to be check for covid.

## 2022-06-03 LAB — SARS CORONAVIRUS 2 (TAT 6-24 HRS): SARS Coronavirus 2: NEGATIVE

## 2022-06-06 ENCOUNTER — Ambulatory Visit (HOSPITAL_COMMUNITY): Payer: Medicaid Other | Admitting: Clinical

## 2022-06-07 ENCOUNTER — Ambulatory Visit (INDEPENDENT_AMBULATORY_CARE_PROVIDER_SITE_OTHER): Payer: Medicare HMO | Admitting: Nurse Practitioner

## 2022-06-07 ENCOUNTER — Encounter: Payer: Self-pay | Admitting: Nurse Practitioner

## 2022-06-07 VITALS — BP 96/76 | HR 97 | Temp 97.3°F | Ht 73.0 in | Wt 222.6 lb

## 2022-06-07 DIAGNOSIS — Z1211 Encounter for screening for malignant neoplasm of colon: Secondary | ICD-10-CM

## 2022-06-07 DIAGNOSIS — R7303 Prediabetes: Secondary | ICD-10-CM

## 2022-06-07 DIAGNOSIS — Z13220 Encounter for screening for lipoid disorders: Secondary | ICD-10-CM

## 2022-06-07 MED ORDER — FLUTICASONE PROPIONATE 50 MCG/ACT NA SUSP: 1.0000 | Freq: Every day | NASAL | 0 refills | Status: AC | PRN

## 2022-06-07 NOTE — Patient Instructions (Signed)
1. Colon cancer screening  - Cologuard  2. Lipid screening  - Lipid Panel  3. Prediabetes  - CBC - Comprehensive metabolic panel - Hemoglobin A1c  Follow up:  Follow up in 1 year or sooner if needed

## 2022-06-07 NOTE — Assessment & Plan Note (Signed)
-   Cologuard  2. Lipid screening  - Lipid Panel  3. Prediabetes  - CBC - Comprehensive metabolic panel - Hemoglobin A1c  Follow up:  Follow up in 1 year or sooner if needed

## 2022-06-07 NOTE — Progress Notes (Addendum)
@Patient  ID: Randall [name], male    DOB: 12/15/1975, 47 y.o.   MRN: [id number]  Chief Complaint  Patient presents with   Follow-up    Over all health    Referring provider: Maurice Small, MD   HPI  Patient presents today for follow up.  Patient does have a history of mental illness and does see [facility] behavioral health for this.  Overall patient has been doing well.   We will order Cologuard for him for colon cancer screening.  He does need refill on Flonase today.  Overall no new issues or concerns today.  He does need lipid recheck today.  Denies f/c/s, n/v/d, hemoptysis, PND, leg swelling. Denies chest pain or edema.       No Known Allergies  Immunization History  Administered Date(s) Administered   Pfizer Fall 2023 Covid-19 Vaccine 6mos thru 52yrs  11/18/2021    Past Medical History:  Diagnosis Date   Bipolar 1 disorder (HCC)    under control   History of kidney stones    Sinus drainage    seasonal-spring    Tobacco History: Social History   Tobacco Use  Smoking Status Every Day   Packs/day: 1   Types: Cigarettes  Smokeless Tobacco Never   Ready to quit: Not Answered Counseling given: Not Answered   Outpatient Encounter Medications as of 06/07/2022  Medication Sig   Cetirizine HCl 10 MG CAPS Take 1 capsule (10 mg total) by mouth daily.   divalproex (DEPAKOTE ER) 500 MG 24 hr tablet Take 1 tablet (500 mg total) by mouth at bedtime.   hydrOXYzine (ATARAX) 25 MG tablet Take 1 tablet (25 mg total) by mouth 3 (three) times daily as needed.   loperamide (IMODIUM) 2 MG capsule Take 1 capsule (2 mg total) by mouth 4 (four) times daily as needed for diarrhea or loose stools.   nicotine (NICODERM CQ) 21 mg/24hr patch Place 1 patch (21 mg total) onto the skin daily.   risperiDONE (RISPERDAL) 1 MG tablet Take 1 tablet (1 mg total) by mouth at bedtime.   simvastatin (ZOCOR) 10 MG tablet Take 1 tablet (10 mg total) by mouth every evening.    [DISCONTINUED] fluticasone (FLONASE) 50 MCG/ACT nasal spray Place 1-2 sprays into both nostrils daily as needed for up to 7 days for allergies.   fluticasone (FLONASE) 50 MCG/ACT nasal spray Place 1-2 sprays into both nostrils daily as needed for up to 7 days for allergies.   No facility-administered encounter medications on file as of 06/07/2022.     Review of Systems  Review of Systems  Constitutional: Negative.   HENT: Negative.    Cardiovascular: Negative.   Gastrointestinal: Negative.   Allergic/Immunologic: Negative.   Neurological: Negative.   Psychiatric/Behavioral: Negative.         Physical Exam  BP 96/76   Pulse 97   Temp (!) 97.3 F (36.3 C)   Ht 6\' 1"  (1.854 m)   Wt 222 lb 9.6 oz (101 kg)   SpO2 99%   BMI 29.37 kg/m   Wt Readings from Last 5 Encounters:  06/07/22 222 lb 9.6 oz (101 kg)  03/02/22 218 lb (98.9 kg)  12/07/21 218 lb (98.9 kg)  12/13/18 201 lb (91.2 kg)  12/23/13 207 lb (93.9 kg)     Physical Exam Vitals and nursing note reviewed.  Constitutional:      General: He is not in acute distress.    Appearance: He is well-developed.  Cardiovascular:  Rate and Rhythm: Normal rate and regular rhythm.  Pulmonary:     Effort: Pulmonary effort is normal.     Breath sounds: Normal breath sounds.  Skin:    General: Skin is warm and dry.  Neurological:     Mental Status: He is alert and oriented to person, place, and time.      Lab Results:  CBC    Component Value Date/Time   WBC 7.7 12/07/2021 1434   WBC 8.9 05/27/2019 0416   RBC 4.97 12/07/2021 1434   RBC 4.90 05/27/2019 0416   HGB 14.5 12/07/2021 1434   HCT 42.1 12/07/2021 1434   PLT 248 12/07/2021 1434   MCV 85 12/07/2021 1434   MCH 29.2 12/07/2021 1434   MCH 30.4 05/27/2019 0416   MCHC 34.4 12/07/2021 1434   MCHC 32.3 05/27/2019 0416   RDW 12.0 12/07/2021 1434   LYMPHSABS 2.5 11/21/2013 1100   MONOABS 0.6 11/21/2013 1100   EOSABS 0.1 11/21/2013 1100   BASOSABS 0.0  11/21/2013 1100    BMET    Component Value Date/Time   NA 139 12/07/2021 1434   K 4.1 12/07/2021 1434   CL 102 12/07/2021 1434   CO2 24 12/07/2021 1434   GLUCOSE 86 12/07/2021 1434   GLUCOSE 108 (H) 05/27/2019 0416   BUN 12 12/07/2021 1434   CREATININE 1.09 12/07/2021 1434   CALCIUM 9.5 12/07/2021 1434   GFRNONAA >60 05/27/2019 0416   GFRAA >60 05/27/2019 0416     Assessment & Plan:   Colon cancer screening - Cologuard  2. Lipid screening  - Lipid Panel  3. Prediabetes  - CBC - Comprehensive metabolic panel - Hemoglobin A1c  Follow up:  Follow up in 1 year or sooner if needed     Ivonne Andrew, NP 06/07/2022

## 2022-06-08 ENCOUNTER — Other Ambulatory Visit: Payer: Self-pay | Admitting: Nurse Practitioner

## 2022-06-08 LAB — LIPID PANEL
Chol/HDL Ratio: 6.2 ratio — ABNORMAL HIGH (ref 0.0–5.0)
Cholesterol, Total: 185 mg/dL (ref 100–199)
HDL: 30 mg/dL — ABNORMAL LOW (ref >39)
LDL Chol Calc (NIH): 123 mg/dL — ABNORMAL HIGH (ref 0–99)
Triglycerides: 177 mg/dL — ABNORMAL HIGH (ref 0–149)
VLDL Cholesterol Cal: 32 mg/dL (ref 5–40)

## 2022-06-08 LAB — COMPREHENSIVE METABOLIC PANEL
ALT: 31 IU/L (ref 0–44)
AST: 28 IU/L (ref 0–40)
Albumin/Globulin Ratio: 1.5 (ref 1.2–2.2)
Albumin: 4.7 g/dL (ref 4.1–5.1)
Alkaline Phosphatase: 63 IU/L (ref 44–121)
BUN/Creatinine Ratio: 11 (ref 9–20)
BUN: 14 mg/dL (ref 6–24)
Bilirubin Total: 0.4 mg/dL (ref 0.0–1.2)
CO2: 22 mmol/L (ref 20–29)
Calcium: 9.8 mg/dL (ref 8.7–10.2)
Chloride: 104 mmol/L (ref 96–106)
Creatinine, Ser: 1.3 mg/dL — ABNORMAL HIGH (ref 0.76–1.27)
Globulin, Total: 3.2 g/dL (ref 1.5–4.5)
Glucose: 95 mg/dL (ref 70–99)
Potassium: 4 mmol/L (ref 3.5–5.2)
Sodium: 142 mmol/L (ref 134–144)
Total Protein: 7.9 g/dL (ref 6.0–8.5)
eGFR: 69 mL/min/{1.73_m2} (ref >59)

## 2022-06-08 LAB — CBC
Hematocrit: 41.6 % (ref 37.5–51.0)
Hemoglobin: 13.8 g/dL (ref 13.0–17.7)
MCH: 28.8 pg (ref 26.6–33.0)
MCHC: 33.2 g/dL (ref 31.5–35.7)
MCV: 87 fL (ref 79–97)
Platelets: 218 10*3/uL (ref 150–450)
RBC: 4.79 x10E6/uL (ref 4.14–5.80)
RDW: 12.6 % (ref 11.6–15.4)
WBC: 10.5 10*3/uL (ref 3.4–10.8)

## 2022-06-08 LAB — HEMOGLOBIN A1C
Est. average glucose Bld gHb Est-mCnc: 126 mg/dL
Hgb A1c MFr Bld: 6 % — ABNORMAL HIGH (ref 4.8–5.6)

## 2022-06-08 MED ORDER — OMEGA-3-ACID ETHYL ESTERS 1 G PO CAPS: 1.0000 g | ORAL_CAPSULE | Freq: Two times a day (BID) | ORAL | 2 refills | Status: DC

## 2022-06-26 ENCOUNTER — Ambulatory Visit (INDEPENDENT_AMBULATORY_CARE_PROVIDER_SITE_OTHER): Payer: Medicare HMO | Admitting: Clinical

## 2022-06-26 DIAGNOSIS — F250 Schizoaffective disorder, bipolar type: Secondary | ICD-10-CM

## 2022-06-26 NOTE — Progress Notes (Signed)
   THERAPIST PROGRESS NOTE  Session Time: 20 minutes  Participation Level: Active  Behavioral Response: CasualAlertEuthymic  Type of Therapy: Individual Therapy  Treatment Goals addressed: client will implement problem solving and resolution skills to address interpersonal and personal problems evidenced by self report 1x per session  ProgressTowards Goals: Progressing  Interventions: CBT and Supportive  Summary:  Randall [name] is a 47 y.o. male who presents for the scheduled appointment oriented times five, appropriately dressed and friendly. Client denied hallucinations and delusions. Client reported on today he is doing well. Client reported he was having some depressive symptoms as it relates to dealing with family. Client reported he is learning to let them be. Client reported talking to his pastor about accepting what is outside of his control. Client reported his family is getting together to have a reunion in July but he is not sure if he will want to go. Client reported otherwise he is going to his sons high school graduation soon. Client reported his relationship is good. Evidence of progress towards goal:  client reported 2 positives of medication compliance and boundaries.  Suicidal/Homicidal: Nowithout intent/plan  Therapist Response:  Therapist began the appointment asking the client how he has been doing since last seen. Therapist used CBT to engage using active listening and positive emotional support. Therapist used CBT to engage and ask the client about any psychosocial stressors. Therapist used CBT to reinforce boundaries. Therapist used CBT ask the client to identify his progress with frequency of use with coping skills with continued practice in his daily activity.    Therapist assigned the client homework to practice self care.   Plan: Return again in 3 weeks.  Diagnosis: schizoaffective disorder, bipolar type  Collaboration of Care: Patient refused AEB  none requested by the client.  Patient/Guardian was advised Release of Information must be obtained prior to any record release in order to collaborate their care with an outside provider. Patient/Guardian was advised if they have not already done so to contact the registration department to sign all necessary forms in order for [place] to release information regarding their care.   Consent: Patient/Guardian gives verbal consent for treatment and assignment of benefits for services provided during this visit. Patient/Guardian expressed understanding and agreed to proceed.   Neena Rhymes Lorella Gomez, LCSW 06/26/2022

## 2022-07-10 ENCOUNTER — Ambulatory Visit (INDEPENDENT_AMBULATORY_CARE_PROVIDER_SITE_OTHER): Payer: Medicare HMO | Admitting: Student in an Organized Health Care Education/Training Program

## 2022-07-10 ENCOUNTER — Encounter (HOSPITAL_COMMUNITY): Payer: Self-pay | Admitting: Student in an Organized Health Care Education/Training Program

## 2022-07-10 VITALS — BP 125/97 | HR 74 | Resp 16 | Wt 228.0 lb

## 2022-07-10 DIAGNOSIS — F17210 Nicotine dependence, cigarettes, uncomplicated: Secondary | ICD-10-CM | POA: Diagnosis not present

## 2022-07-10 DIAGNOSIS — F411 Generalized anxiety disorder: Secondary | ICD-10-CM | POA: Diagnosis not present

## 2022-07-10 DIAGNOSIS — F250 Schizoaffective disorder, bipolar type: Secondary | ICD-10-CM | POA: Diagnosis not present

## 2022-07-10 MED ORDER — PROPRANOLOL HCL ER 60 MG PO CP24
60.0000 mg | ORAL_CAPSULE | Freq: Every day | ORAL | 2 refills | Status: DC
Start: 2022-07-10 — End: 2022-11-09

## 2022-07-10 MED ORDER — DIVALPROEX SODIUM ER 500 MG PO TB24
500.0000 mg | ORAL_TABLET | Freq: Every day | ORAL | 2 refills | Status: DC
Start: 2022-07-10 — End: 2022-11-09

## 2022-07-10 MED ORDER — RISPERIDONE 1 MG PO TABS
1.0000 mg | ORAL_TABLET | Freq: Every day | ORAL | 2 refills | Status: DC
Start: 2022-07-10 — End: 2022-11-09

## 2022-07-10 MED ORDER — NICOTINE 14 MG/24HR TD PT24
14.0000 mg | MEDICATED_PATCH | Freq: Every day | TRANSDERMAL | 0 refills | Status: DC
Start: 2022-09-06 — End: 2022-11-09

## 2022-07-10 MED ORDER — HYDROXYZINE HCL 25 MG PO TABS: 25.0000 mg | ORAL_TABLET | Freq: Three times a day (TID) | ORAL | 1 refills | Status: DC | PRN

## 2022-07-10 MED ORDER — NICOTINE 21 MG/24HR TD PT24
21.0000 mg | MEDICATED_PATCH | Freq: Every day | TRANSDERMAL | 1 refills | Status: DC
Start: 2022-07-10 — End: 2022-11-09

## 2022-07-10 NOTE — Progress Notes (Signed)
BH MD/PA/NP OP Progress Note  07/10/2022 4:21 PM Randall [name]  MRN:  [id number]  Chief Complaint:  Chief Complaint  Patient presents with   Follow-up   HPI:  Randall [name] is a 47 yo patient with PPH of schizoaffective disorder, bipolar type and GAD seen for follow-up for medication management.  He is currently being managed on risperidone 1 mg nightly, Depakote ER 500 mg nightly and hydroxyzine 25 mg 3 times daily for anxiety.  Patient denies having adverse side effects from current medication regimen.   Patient reports that he is doing well, he has been doing jobs Environmental manager. Patient reports that he was recently started on fish oil for his high cholesterol, just reviewed his labs and his total cholesterol is ok. Patient reports that he recently went to see some family, in [place]. He enjoyed his time seeing his son, but it was difficult seeing his siblings. Patient reports that it did trigger some of his anger and irritability, but he does not feel like he decompensated. Patient reports that he is averaging 7h of sleep each night and 1hr nap during day. Patient reports that his appetite is good and he is staying hydrated. Patient denies SI, HI, and AVH.Patient does feel like he is doing better since dc'ing the day time risperdal. He does not endorse symptoms of paranoia. He does feel like is a little agitated without the daytime dose, but he has been trying to go for walks and disconnect from his phone.    Patient does endorse that he feels restless, he feels like he has to keep moving around. Patient reports he does feel a little nervous, he denies feeling like his thoughts are racing.  Patient denies feeling impulsive. Patient reports that he has a lot energy, but denies any big changes in behavior.   Patient reports that he is smoking 1ppd.  Caffeine: not daily, may drink 2 cups max in the AM if he does drink Etoh- denies  Visit Diagnosis:    ICD-10-CM   1. Schizoaffective  disorder, bipolar type (HCC)  F25.0     2. Generalized anxiety disorder  F41.1     3. Cigarette nicotine dependence without complication  F17.210       Past Psychiatric History:   INPT:  1x for mental breakdown Oupt: Since childhood, was also seen by [facility] as adult Dx: In [place] initially diagnosed with undetermined mood disorder, this later was changed to Schizoaffective disorder, bipolar type, Dx with GAD, received tx for this as a child, Childhood dx of Learning disability was in Special education classes from K- 9th grade.   Previous medications: Prozac (childhood, was not helpful eluded to this worsening behavior as a child), Zyprexa (unsure but believes he may have still had significant irritability on this), Risperdal, Depakote (beneficial)   Last visit:  04/2022- Dc 0.5mg  Risperdal, dur to increased appetite and sedation. Continued 1mg  at bedtime risperdal and Depakote ED, Depakote lvl was 52, patient mood was stable.  01/2022- Patient appeared to be doing fairly well, hydroxyzine was increased to 25mg  due to endorsing when he does take the medication he was taking approx 20mg  at a time, interested in tobacco cessation , started on NRT patch at 21mg  daily, depakote level 52, 02/21/2022 12/2021-patient endorses significant irritability and concern regarding this.  Patient started back on Depakote ER at 500 mg.  Patient continued on his Risperdal 0.5 mg in the morning and 1 mg nightly as well as hydroxyzine 10 mg 3 times  daily as needed.   Past Medical History:  Past Medical History:  Diagnosis Date   Bipolar 1 disorder (HCC)    under control   History of kidney stones    Sinus drainage    seasonal-spring    Past Surgical History:  Procedure Laterality Date   CIRCUMCISION  age 67   MULTIPLE EXTRACTIONS WITH ALVEOLOPLASTY N/A 12/13/2018   Procedure: MULTIPLE EXTRACTION WITH ALVEOLOPLASTY;  Surgeon: Ocie Doyne, DDS;  Location: [facility] OR;  Service: Oral Surgery;  Laterality: N/A;    NEPHROLITHOTOMY Right 12/23/2013   Procedure: RIGHT PERCUTANEOUS NEPHROLITHOTOMY WITH SURGEON ACCESS;  Surgeon: Crist Fat, MD;  Location: [facility] ORS;  Service: Urology;  Laterality: Right;    Family Psychiatric History: 1 son: Born premature, has Autism and ADHD 1 daughter: Autism Father: Etoh use disorder  Family History:  Family History  Problem Relation Age of Onset   Lung cancer Mother    Heart attack Father     Social History:  Social History   Socioeconomic History   Marital status: Single    Spouse name: Not on file   Number of children: Not on file   Years of education: Not on file   Highest education level: Not on file  Occupational History   Not on file  Tobacco Use   Smoking status: Every Day    Packs/day: 1    Types: Cigarettes   Smokeless tobacco: Never  Vaping Use   Vaping Use: Never used  Substance and Sexual Activity   Alcohol use: Not Currently    Comment: occ   Drug use: No   Sexual activity: Not on file  Other Topics Concern   Not on file  Social History Narrative   Not on file   Social Determinants of Health   Financial Resource Strain: Low Risk  (03/02/2022)   Overall Financial Resource Strain (CARDIA)    Difficulty of Paying Living Expenses: Not hard at all  Food Insecurity: No Food Insecurity (03/02/2022)   Hunger Vital Sign    Worried About Running Out of Food in the Last Year: Never true    Ran Out of Food in the Last Year: Never true  Transportation Needs: No Transportation Needs (03/02/2022)   PRAPARE - Administrator, Civil Service (Medical): No    Lack of Transportation (Non-Medical): No  Physical Activity: Inactive (03/02/2022)   Exercise Vital Sign    Days of Exercise per Week: 0 days    Minutes of Exercise per Session: 0 min  Stress: No Stress Concern Present (03/02/2022)   [employer] of Occupational Health - Occupational Stress Questionnaire    Feeling of Stress : Not at all  Social Connections: Moderately  Integrated (03/02/2022)   Social Connection and Isolation Panel [NHANES]    Frequency of Communication with Friends and Family: More than three times a week    Frequency of Social Gatherings with Friends and Family: More than three times a week    Attends Religious Services: More than 4 times per year    Active Member of Clubs or Organizations: Yes    Attends Banker Meetings: More than 4 times per year    Marital Status: Never married    Allergies: No Known Allergies  Metabolic Disorder Labs: Lab Results  Component Value Date   HGBA1C 6.0 (H) 06/07/2022   No results found for: "PROLACTIN" Lab Results  Component Value Date   CHOL 185 06/07/2022   TRIG 177 (H) 06/07/2022  HDL 30 (L) 06/07/2022   CHOLHDL 6.2 (H) 06/07/2022   LDLCALC 123 (H) 06/07/2022   LDLCALC 170 (H) 12/07/2021   Lab Results  Component Value Date   TSH 0.943 12/07/2021    Therapeutic Level Labs: No results found for: "LITHIUM" Lab Results  Component Value Date   VALPROATE 52 02/21/2022   VALPROATE CANCELED 01/03/2022   No results found for: "CBMZ"  Current Medications: Current Outpatient Medications  Medication Sig Dispense Refill   Cetirizine HCl 10 MG CAPS Take 1 capsule (10 mg total) by mouth daily. 30 capsule 0   divalproex (DEPAKOTE ER) 500 MG 24 hr tablet Take 1 tablet (500 mg total) by mouth at bedtime. 30 tablet 2   fluticasone (FLONASE) 50 MCG/ACT nasal spray Place 1-2 sprays into both nostrils daily as needed for up to 7 days for allergies. 16 g 0   hydrOXYzine (ATARAX) 25 MG tablet Take 1 tablet (25 mg total) by mouth 3 (three) times daily as needed. 90 tablet 1   loperamide (IMODIUM) 2 MG capsule Take 1 capsule (2 mg total) by mouth 4 (four) times daily as needed for diarrhea or loose stools. 12 capsule 0   omega-3 acid ethyl esters (LOVAZA) 1 g capsule Take 1 capsule (1 g total) by mouth 2 (two) times daily. 60 capsule 2   risperiDONE (RISPERDAL) 1 MG tablet Take 1 tablet (1  mg total) by mouth at bedtime. 30 tablet 2   simvastatin (ZOCOR) 10 MG tablet Take 1 tablet (10 mg total) by mouth every evening. 30 tablet 11   No current facility-administered medications for this visit.     Musculoskeletal: Strength & Muscle Tone: within normal limits Gait & Station: normal Patient leans: N/A  Psychiatric Specialty Exam: Review of Systems  Psychiatric/Behavioral:  Positive for agitation. Negative for behavioral problems, dysphoric mood, sleep disturbance and suicidal ideas. The patient is nervous/anxious.     Blood pressure (!) 125/97, pulse 74, resp. rate 16, weight 228 lb (103.4 kg), SpO2 99 %.Body mass index is 30.08 kg/m.  General Appearance: Casual  Eye Contact:  Good  Speech:  Clear and Coherent  Volume:  Normal  Mood:  Anxious  Affect:  Congruent  Thought Process:  Coherent  Orientation:  Full (Time, Place, and Person)  Thought Content: Logical   Suicidal Thoughts:  No  Homicidal Thoughts:  No  Memory:  Immediate;   Good Recent;   Good  Judgement:  Good  Insight:  Good  Psychomotor Activity:  Restlessness  Concentration:  Concentration: Good  Recall:  Good  Fund of Knowledge: Good  Language: Good  Akathisia:  No  Handed:    AIMS (if indicated): not done  Assets:  Communication Skills Desire for Improvement Housing Intimacy Resilience Social Support Transportation Vocational/Educational  ADL's:  Intact  Cognition: WNL  Sleep:  Good   Screenings: AIMS    Flowsheet Row Clinical Support from 10/24/2021 in [facility]  AIMS Total Score 0      GAD-7    Flowsheet Row Counselor from 06/26/2022 in [facility] Clinical Support from 10/24/2021 in [facility] Video Visit from 02/22/2021 in [facility] Office Visit from 11/01/2020 in [facility]  Total GAD-7 Score 0 7 8 11       PHQ2-9     Flowsheet Row Clinical Support from 07/10/2022 in [facility] Counselor from 06/26/2022 in [facility] Office  Visit from 06/07/2022 in [facility] Clinical Support from 03/02/2022 in [facility] Counselor from 01/03/2022 in [facility]  PHQ-2 Total Score 2 1 0 0 1  PHQ-9 Total Score 5 1 -- -- 2      Flowsheet Row ED from 06/02/2022 in [facility] from 01/03/2022 in [facility] ED from 11/23/2020 in [facility] at [facility] RISK CATEGORY No Risk No Risk No Risk        Assessment and Plan: Objectively, patient did appear to be a bit more restless similar to initial presentation in 2023.  Patient did endorse feeling more restless, during PHQ-9.  It appears that this feeling started after discontinuing daytime risperidone, or patient's thoughts have not become more pressured Randall [name], patient physically feels restless with an increase in energy.  Do not think patient is endorsing symptoms of mania as his sleep is normal and he is denying impulsivity.  Patient does endorse some symptoms of anxiety.  Rather than increase Depakote due to sedation concerns and hyperlipidemia, we will trial patient on propranolol.  The majority of patient's blood pressures appear within normal range as do the majority of his heart rates however, reviewed with patient adverse side effects such as lightheadedness or feeling faint.  Patient endorsed understanding.  With the starting of propranolol, emphasized to patient that hydroxyzine needs to be as needed.  Patient also endorses wanting to try to quit smoking again, patient recalls that he did not give the patch as a chance previously and would like to try again.  Schizoaffective disorder, bipolar type  GAD   - Change Hydroxyzine to TID PRN - Start Propanolol LA 60mg   daily - Continue Risperidone 1mg  QHS - Continue Depakote ER 500 mg nightly   Cigarette nicotine dependence without complication  - Start NRT patch 21mg  daily x 8 weeks, with plan to titrate down to 14mg  prior to next appt.   Discussed with patient pending transition of care to a new resident starting July 1st, and likely discontinuation of care by this provider at that time.    F/u with Dr. Alfonse Flavors 08/2022  Collaboration of Care: Collaboration of Care:   Patient/Guardian was advised Release of Information must be obtained prior to any record release in order to collaborate their care with an outside provider. Patient/Guardian was advised if they have not already done so to contact the registration department to sign all necessary forms in order for [place] to release information regarding their care.   Consent: Patient/Guardian gives verbal consent for treatment and assignment of benefits for services provided during this visit. Patient/Guardian expressed understanding and agreed to proceed.   PGY-3 Bobbye Morton, MD 07/10/2022, 4:21 PM

## 2022-07-18 ENCOUNTER — Ambulatory Visit (HOSPITAL_COMMUNITY): Payer: Medicaid Other | Admitting: Clinical

## 2022-08-15 ENCOUNTER — Ambulatory Visit (HOSPITAL_COMMUNITY): Payer: Medicare HMO | Admitting: Clinical

## 2022-09-05 ENCOUNTER — Ambulatory Visit (INDEPENDENT_AMBULATORY_CARE_PROVIDER_SITE_OTHER): Payer: Medicare HMO | Admitting: Clinical

## 2022-09-05 ENCOUNTER — Encounter (HOSPITAL_COMMUNITY): Payer: Self-pay

## 2022-09-05 DIAGNOSIS — R6889 Other general symptoms and signs: Secondary | ICD-10-CM | POA: Diagnosis not present

## 2022-09-05 DIAGNOSIS — F250 Schizoaffective disorder, bipolar type: Secondary | ICD-10-CM
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
## 2022-09-15 ENCOUNTER — Encounter (HOSPITAL_COMMUNITY): Payer: Medicare HMO | Admitting: Student

## 2022-09-15 DIAGNOSIS — R6889 Other general symptoms and signs: Secondary | ICD-10-CM | POA: Diagnosis not present

## 2022-10-16 ENCOUNTER — Ambulatory Visit (INDEPENDENT_AMBULATORY_CARE_PROVIDER_SITE_OTHER): Payer: Medicare HMO | Admitting: Clinical

## 2022-10-16 DIAGNOSIS — F250 Schizoaffective disorder, bipolar type: Secondary | ICD-10-CM

## 2022-10-16 DIAGNOSIS — R6889 Other general symptoms and signs: Secondary | ICD-10-CM | POA: Diagnosis not present
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
## 2022-10-24 ENCOUNTER — Ambulatory Visit (HOSPITAL_COMMUNITY): Payer: Medicare HMO | Admitting: Clinical

## 2022-10-31 ENCOUNTER — Encounter (HOSPITAL_COMMUNITY): Payer: Medicare HMO | Admitting: Student

## 2022-11-02 ENCOUNTER — Encounter (HOSPITAL_COMMUNITY): Payer: Medicare HMO | Admitting: Student

## 2022-11-09 ENCOUNTER — Ambulatory Visit (HOSPITAL_COMMUNITY): Payer: Medicare HMO | Admitting: Student

## 2022-11-09 DIAGNOSIS — R6889 Other general symptoms and signs: Secondary | ICD-10-CM | POA: Diagnosis not present

## 2022-11-09 DIAGNOSIS — F1991 Other psychoactive substance use, unspecified, in remission: Secondary | ICD-10-CM

## 2022-11-09 DIAGNOSIS — F250 Schizoaffective disorder, bipolar type: Secondary | ICD-10-CM | POA: Insufficient documentation

## 2022-11-09 DIAGNOSIS — Z79899 Other long term (current) drug therapy: Secondary | ICD-10-CM

## 2022-11-09 MED ORDER — DIVALPROEX SODIUM ER 500 MG PO TB24: 500.0000 mg | ORAL_TABLET | Freq: Every day | ORAL | 2 refills | Status: DC
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
## 2022-11-20 ENCOUNTER — Encounter (HOSPITAL_COMMUNITY): Payer: Self-pay

## 2022-11-20 ENCOUNTER — Ambulatory Visit (INDEPENDENT_AMBULATORY_CARE_PROVIDER_SITE_OTHER): Payer: Medicare HMO | Admitting: Clinical

## 2022-11-20 ENCOUNTER — Other Ambulatory Visit (HOSPITAL_COMMUNITY): Payer: Medicare HMO

## 2022-11-20 ENCOUNTER — Ambulatory Visit (HOSPITAL_COMMUNITY): Payer: Medicare HMO | Admitting: Clinical

## 2022-11-20 DIAGNOSIS — F250 Schizoaffective disorder, bipolar type: Secondary | ICD-10-CM

## 2022-11-20 DIAGNOSIS — R6889 Other general symptoms and signs: Secondary | ICD-10-CM | POA: Diagnosis not present

## 2022-11-22 ENCOUNTER — Other Ambulatory Visit (HOSPITAL_COMMUNITY): Payer: Medicare HMO

## 2022-11-22 ENCOUNTER — Other Ambulatory Visit (HOSPITAL_COMMUNITY): Payer: Self-pay | Admitting: Psychiatry

## 2022-11-22 DIAGNOSIS — Z79899 Other long term (current) drug therapy: Secondary | ICD-10-CM | POA: Diagnosis not present

## 2022-11-23 NOTE — Progress Notes (Signed)
   THERAPIST PROGRESS NOTE  Session Time: 25 minutes  Participation Level: Active  Behavioral Response: CasualAlertEuthymic  Type of Therapy: Individual Therapy  Treatment Goals addressed: client will complete 80% of assigned homework  ProgressTowards Goals: Progressing  Interventions: CBT and Supportive  Summary:  Randall [name] is a 47 y.o. male who presents for the scheduled appointment oriented x 5, appropriately dressed, and friendly.  Client denied hallucinations and delusions. Client reported on today he is doing well. Client reported he has still been having some issues with his ex-girlfriend trying to get back in communication with him. Client reported he does not understand why she would go to the link so trying to make him out to be a bad person and then try to get back into a relationship with him. Client reported he is keeping the same boundaries around that.  Client reported otherwise he has been talking with his son to keep and positively encouraged to finish out school doing well.  Client reported he himself is doing good.  Client reported he is making 1 making some better eating habits as his primary doctor has instructed him to. Evidence of progress towards goal:  clients is medication compliant and practicing boundaries 7 days per week.   Suicidal/Homicidal: Nowithout intent/plan  Therapist Response:  Therapist began the appointment asking the client how he has been doing since last seen. Therapist used cbt to engage using active listening and positive emotional support. Therapist used cbt to ask the client to identify stressors. Therapist used cbt to positively teach and reinforce boundaries. Therapist used CBT ask the client to identify his progress with frequency of use with coping skills with continued practice in his daily activity.      Plan: Return again in 4 weeks.  Diagnosis: schizoaffective disorder, bipolar type  Collaboration of Care: Patient  refused AEB none requested by the client.  Patient/Guardian was advised Release of Information must be obtained prior to any record release in order to collaborate their care with an outside provider. Patient/Guardian was advised if they have not already done so to contact the registration department to sign all necessary forms in order for [place] to release information regarding their care.   Consent: Patient/Guardian gives verbal consent for treatment and assignment of benefits for services provided during this visit. Patient/Guardian expressed understanding and agreed to proceed.   Neena Rhymes Prosperity Darrough, LCSW 11/20/2022

## 2022-11-24 LAB — CBC WITH DIFFERENTIAL/PLATELET
Hematocrit: 53.4 % — ABNORMAL HIGH (ref 37.5–51.0)
Hemoglobin: 16.3 g/dL (ref 13.0–17.7)
MCH: 29 pg (ref 26.6–33.0)
MCHC: 30.5 g/dL — ABNORMAL LOW (ref 31.5–35.7)
MCV: 95 fL (ref 79–97)
Platelets: 260 10*3/uL (ref 150–450)
RBC: 5.63 x10E6/uL (ref 4.14–5.80)
RDW: 12.9 % (ref 11.6–15.4)
WBC: 8.7 10*3/uL (ref 3.4–10.8)

## 2022-11-25 LAB — SPECIMEN STATUS REPORT

## 2022-11-25 LAB — VALPROIC ACID LEVEL: Valproic Acid Lvl: 40 ug/mL — ABNORMAL LOW (ref 50–100)

## 2022-11-28 DIAGNOSIS — H52223 Regular astigmatism, bilateral: Secondary | ICD-10-CM | POA: Diagnosis not present

## 2022-11-28 DIAGNOSIS — R6889 Other general symptoms and signs: Secondary | ICD-10-CM | POA: Diagnosis not present

## 2022-12-06 ENCOUNTER — Other Ambulatory Visit: Payer: Self-pay

## 2022-12-06 DIAGNOSIS — E782 Mixed hyperlipidemia: Secondary | ICD-10-CM

## 2022-12-06 MED ORDER — SIMVASTATIN 10 MG PO TABS: 10.0000 mg | ORAL_TABLET | Freq: Every evening | ORAL | 1 refills | Status: DC

## 2022-12-07 ENCOUNTER — Ambulatory Visit (INDEPENDENT_AMBULATORY_CARE_PROVIDER_SITE_OTHER): Payer: Medicare HMO | Admitting: Student

## 2022-12-07 DIAGNOSIS — R6889 Other general symptoms and signs: Secondary | ICD-10-CM | POA: Diagnosis not present

## 2022-12-07 DIAGNOSIS — F250 Schizoaffective disorder, bipolar type: Secondary | ICD-10-CM

## 2022-12-07 DIAGNOSIS — Z79899 Other long term (current) drug therapy: Secondary | ICD-10-CM

## 2022-12-07 MED ORDER — DIVALPROEX SODIUM ER 500 MG PO TB24
500.0000 mg | ORAL_TABLET | Freq: Every day | ORAL | 2 refills | Status: DC
Start: 2022-12-07 — End: 2023-03-27

## 2022-12-07 NOTE — Progress Notes (Signed)
BH MD Outpatient Progress Note  12/11/2022 9:03 AM [name]  MRN:  [id number]  Assessment:  Randall [name] presents for follow-up evaluation.  At the last visit the patient was restarted on Depakote ER 500 mg nightly, a previously effective medication.  Today the patient reports he is doing well.  He exhibits a linear and logical thought process and has appropriate affect.  He is satisfied with the medication regimen and we will continue it with no changes.   Identifying Information: Randall [name] is a 47 y.o. y.o. male with a history of schizoaffective disorder bipolar type who is an established patient with [facility] for management of the aforementioned diagnosis.   Plan:  # Hx schizoaffective disorder bipolar type, exact diagnosis uncertain at this time Interventions: -- Continue Depakote ER 500 mg nightly - VPA level of 40, platelets within normal limits 10/30 - Continue therapy with Paige  Patient was given contact information for behavioral health clinic and was instructed to call 911 for emergencies.   Subjective:  Chief Complaint:  Chief Complaint  Patient presents with   Follow-up    Interval History:  Today the patient reports that he is doing "better".  He feels that "my thinking is improved".  He states that he has not had any episodes of hypomania or depression.  He denies experiencing any hopelessness or suicidal thoughts.  He reports appropriate sleep and appetite.  He states that he has been moving furniture for work and is hopeful about getting a house soon.  The patient does receive disability income.  The patient does state that he is back in a relationship with his on again off again girlfriend.  He says this is going well for right now.  He appears to have realistic expectations about the relationship.  He does note that Depakote has increased his appetite but does not find this particularly bothersome.  The patient denies  experiencing any auditory hallucinations or paranoia.   Visit Diagnosis:    ICD-10-CM   1. Schizoaffective disorder, bipolar type (HCC)  F25.0 divalproex (DEPAKOTE ER) 500 MG 24 hr tablet    2. High risk medication use: Depakote  Z79.899       Past Psychiatric History:  The patient denies any history of self-harm behavior, including suicide attempts.  He reports 1 psychiatric admission occurring in 2001/12/25, after the death of his mother.  Patient has previously tried risperidone, but may have been experiencing akathisia.  Past Medical History:  Past Medical History:  Diagnosis Date   Bipolar 1 disorder (HCC)    under control   History of kidney stones    Sinus drainage    seasonal-spring    Past Surgical History:  Procedure Laterality Date   CIRCUMCISION  age 61   MULTIPLE EXTRACTIONS WITH ALVEOLOPLASTY N/A 12/13/2018   Procedure: MULTIPLE EXTRACTION WITH ALVEOLOPLASTY;  Surgeon: Ocie Doyne, DDS;  Location: [facility] OR;  Service: Oral Surgery;  Laterality: N/A;   NEPHROLITHOTOMY Right 12/23/2013   Procedure: RIGHT PERCUTANEOUS NEPHROLITHOTOMY WITH SURGEON ACCESS;  Surgeon: Crist Fat, MD;  Location: [facility];  Service: Urology;  Laterality: Right;    Family Psychiatric History: None pertinent  Family History:  Family History  Problem Relation Age of Onset   Lung cancer Mother    Heart attack Father     Social History:  Social History   Socioeconomic History   Marital status: Single    Spouse name: Not on file   Number of children:  Not on file   Years of education: Not on file   Highest education level: Not on file  Occupational History   Not on file  Tobacco Use   Smoking status: Every Day    Current packs/day: 1.00    Types: Cigarettes   Smokeless tobacco: Never  Vaping Use   Vaping status: Never Used  Substance and Sexual Activity   Alcohol use: Not Currently    Comment: occ   Drug use: No   Sexual activity: Not on file  Other Topics Concern   Not on  file  Social History Narrative   Not on file   Social Determinants of Health   Financial Resource Strain: Low Risk  (03/02/2022)   Overall Financial Resource Strain (CARDIA)    Difficulty of Paying Living Expenses: Not hard at all  Food Insecurity: No Food Insecurity (03/02/2022)   Hunger Vital Sign    Worried About Running Out of Food in the Last Year: Never true    Ran Out of Food in the Last Year: Never true  Transportation Needs: No Transportation Needs (03/02/2022)   PRAPARE - Administrator, Civil Service (Medical): No    Lack of Transportation (Non-Medical): No  Physical Activity: Inactive (03/02/2022)   Exercise Vital Sign    Days of Exercise per Week: 0 days    Minutes of Exercise per Session: 0 min  Stress: No Stress Concern Present (03/02/2022)   [employer] of Occupational Health - Occupational Stress Questionnaire    Feeling of Stress : Not at all  Social Connections: Moderately Integrated (03/02/2022)   Social Connection and Isolation Panel [NHANES]    Frequency of Communication with Friends and Family: More than three times a week    Frequency of Social Gatherings with Friends and Family: More than three times a week    Attends Religious Services: More than 4 times per year    Active Member of [employer] or Organizations: Yes    Attends Engineer, structural: More than 4 times per year    Marital Status: Never married    Allergies: No Known Allergies  Current Medications: Current Outpatient Medications  Medication Sig Dispense Refill   divalproex (DEPAKOTE ER) 500 MG 24 hr tablet Take 1 tablet (500 mg total) by mouth at bedtime. 30 tablet 2   fluticasone (FLONASE) 50 MCG/ACT nasal spray Place 1-2 sprays into both nostrils daily as needed for up to 7 days for allergies. 16 g 0   loperamide (IMODIUM) 2 MG capsule Take 1 capsule (2 mg total) by mouth 4 (four) times daily as needed for diarrhea or loose stools. 12 capsule 0   omega-3 acid ethyl esters  (LOVAZA) 1 g capsule Take 1 capsule (1 g total) by mouth 2 (two) times daily. 60 capsule 2   simvastatin (ZOCOR) 10 MG tablet Take 1 tablet (10 mg total) by mouth every evening. 30 tablet 1   No current facility-administered medications for this visit.     Objective:  Psychiatric Specialty Exam: Physical Exam Constitutional:      Appearance: the patient is not toxic-appearing.  Pulmonary:     Effort: Pulmonary effort is normal.  Neurological:     General: No focal deficit present.     Mental Status: the patient is alert and oriented to person, place, and time.   Review of Systems  Respiratory:  Negative for shortness of breath.   Cardiovascular:  Negative for chest pain.  Gastrointestinal:  Negative for abdominal pain,  constipation, diarrhea, nausea and vomiting.  Neurological:  Negative for headaches.      There were no vitals taken for this visit.  General Appearance: Fairly Groomed  Eye Contact:  Good  Speech:  Clear and Coherent  Volume:  Normal  Mood: "Okay"  Affect:  Congruent, appropriate  Thought Process:  Coherent  Orientation:  Full (Time, Place, and Person)  Thought Content: Logical   Suicidal Thoughts:  No  Homicidal Thoughts:  No  Memory:  Immediate;   Good  Judgement:  fair  Insight:  fair  Psychomotor Activity:  Normal  Concentration:  Concentration: Good  Recall:  Good  Fund of Knowledge: Good  Language: Good  Akathisia:  No  Handed:    AIMS (if indicated): not done  Assets:  Communication Skills Desire for Improvement Financial Resources/Insurance Housing Leisure Time Physical Health  ADL's:  Intact  Cognition: WNL  Sleep:  Fair     Metabolic Disorder Labs: Lab Results  Component Value Date   HGBA1C 6.0 (H) 06/07/2022   No results found for: "PROLACTIN" Lab Results  Component Value Date   CHOL 185 06/07/2022   TRIG 177 (H) 06/07/2022   HDL 30 (L) 06/07/2022   CHOLHDL 6.2 (H) 06/07/2022   LDLCALC 123 (H) 06/07/2022   LDLCALC  170 (H) 12/07/2021   Lab Results  Component Value Date   TSH 0.943 12/07/2021    Therapeutic Level Labs: No results found for: "LITHIUM" Lab Results  Component Value Date   VALPROATE 40 (L) 11/22/2022   VALPROATE 52 02/21/2022   No results found for: "CBMZ"  Screenings: AIMS    Flowsheet Row Clinical Support from 10/24/2021 in [facility]  AIMS Total Score 0      GAD-7    Flowsheet Row Counselor from 06/26/2022 in [facility] Clinical Support from 10/24/2021 in [facility] Video Visit from 02/22/2021 in [facility] Office Visit from 11/01/2020 in [facility]  Total GAD-7 Score 0 7 8 11       PHQ2-9    Flowsheet Row Counselor from 09/05/2022 in [facility] Clinical Support from 07/10/2022 in [facility] Counselor from 06/26/2022 in [facility] Office Visit from 06/07/2022 in [facility] Clinical Support from 03/02/2022 in [facility]  PHQ-2 Total Score 1 2 1  0 0  PHQ-9 Total Score 2 5 1  -- --      Flowsheet Row ED from 06/02/2022 in [facility] from 01/03/2022 in [facility] ED from 11/23/2020 in [facility] at [facility] RISK CATEGORY No Risk No Risk No Risk       Collaboration of Care: none  A total of 30 minutes was spent involved in face to face clinical care, chart review, documentation.   Carlyn Reichert, MD 12/11/2022, 9:03 AM

## 2022-12-12 ENCOUNTER — Ambulatory Visit (HOSPITAL_COMMUNITY): Payer: Medicare HMO | Admitting: Clinical

## 2022-12-12 DIAGNOSIS — R6889 Other general symptoms and signs: Secondary | ICD-10-CM | POA: Diagnosis not present

## 2022-12-12 DIAGNOSIS — F250 Schizoaffective disorder, bipolar type: Secondary | ICD-10-CM | POA: Diagnosis not present

## 2022-12-12 NOTE — Progress Notes (Signed)
   THERAPIST PROGRESS NOTE  Session Time: 25 minutes  Participation Level: Active  Behavioral Response: CasualAlertEuthymic  Type of Therapy: Individual Therapy  Treatment Goals addressed: Bowen will identify situations, thoughts, and feelings that trigger internal anger, and/or angry/aggressive actions as evidenced by self-report 1x per session   ProgressTowards Goals: Progressing  Interventions: CBT and Supportive  Summary:  Randall [name] is a 47 y.o. male who presents for the scheduled appointment oriented times five, appropriately dressed and friendly. Client denied hallucinations and delusions. Client reported on today he is doing well. Client reported he will spend thanksgiving at home and see his extended family at [place]. Client reported his appointment with the psychiatrist went well. Client reported he is reduced to one medication depakote. Client reported he is fine with that and feels better. Client reported he has been going to church which has helped him to stay grounded. Client reported no other concerns. Evidence of progress towards goal:  client reported medication compliance 7 days per week.   Suicidal/Homicidal: Nowithout intent/plan  Therapist Response:  Therapist began the appointment asking the client how he has been doing. Therapist used cbt to engage using active listening and positive emotional support. Therapist used cbt to engage and ask the client about his mood and if any stressors are present. Therapist used cbt to engage and positive reinforce his self care and positive daily activity. Therapist used CBT ask the client to identify his progress with frequency of use with coping skills with continued practice in his daily activity.    Therapist assigned the client homework to practice self care.   Plan: Return again in 4 weeks.  Diagnosis: schizoaffective disorder, bipolar type  Collaboration of Care: Patient refused AEB none requested by the  client.  Patient/Guardian was advised Release of Information must be obtained prior to any record release in order to collaborate their care with an outside provider. Patient/Guardian was advised if they have not already done so to contact the registration department to sign all necessary forms in order for [place] to release information regarding their care.   Consent: Patient/Guardian gives verbal consent for treatment and assignment of benefits for services provided during this visit. Patient/Guardian expressed understanding and agreed to proceed.   Neena Rhymes Randall Inlow, LCSW 12/12/2022

## 2023-01-09 ENCOUNTER — Ambulatory Visit (HOSPITAL_COMMUNITY): Payer: Medicare HMO | Admitting: Clinical

## 2023-01-30 ENCOUNTER — Ambulatory Visit (HOSPITAL_COMMUNITY): Payer: Medicare HMO | Admitting: Clinical

## 2023-02-02 ENCOUNTER — Encounter (HOSPITAL_COMMUNITY): Payer: Medicare HMO | Admitting: Student

## 2023-02-02 ENCOUNTER — Encounter (HOSPITAL_COMMUNITY): Payer: Self-pay

## 2023-02-06 ENCOUNTER — Ambulatory Visit (INDEPENDENT_AMBULATORY_CARE_PROVIDER_SITE_OTHER): Payer: Medicare HMO | Admitting: Clinical

## 2023-02-06 DIAGNOSIS — F250 Schizoaffective disorder, bipolar type: Secondary | ICD-10-CM

## 2023-02-06 NOTE — Progress Notes (Signed)
   THERAPIST PROGRESS NOTE  Session Time: 25 minutes  Participation Level: Active  Behavioral Response: CasualAlertEuthymic  Type of Therapy: Individual Therapy  Treatment Goals addressed: Learn and implement problem solving and resolution skills to address interpersonal and personal problems evidenced by self report 1x per therapy session   ProgressTowards Goals: Progressing  Interventions: CBT  Summary:  Mathew Postiglione is a 48 y.o. male who presents for the scheduled appointment oriented x 5, appropriately dressed, and friendly.  Client denied hallucinations and delusions. Client reported on today he is doing well.  Client reported he is happy because he is now moved into his own place.  Client reported it is a much better environment for him.  Client reported he is getting things in having this please fix that.  Client reported his holidays were good and he spoke to all of his children.  Client reported his daughter who is in the [employer] located in [place] they talked via FaceTime and she is doing good.  Client reported otherwise he is keeping up with his doctors appointments for help also and that is going well.  Client reported no issues with depression or anxiety at this time. Evidence of progress towards goal: Client reported medication compliant 7 days a week.  Client reported 1 accomplishment of obtaining his own place.  Suicidal/Homicidal: Nowithout intent/plan  Therapist Response:  Therapist began the appointment asking client has been doing since last seen. Therapist used CBT to engage with active listening and positive emotional support. Therapist used CBT to ask the client about any changes and/or how things are going with his home and family life. Therapist used CBT to positively reinforce the clients accomplishment of housing and positive dynamics with his family. Therapist used CBT ask the client to identify his progress with frequency of use with coping skills with  continued practice in his daily activity.    Therapist assigned client homework to practice self-care.   Plan: Return again in 4 weeks.  Diagnosis: schizoaffective bipolar type  Collaboration of Care: Patient refused AEB none requested by the client.  Patient/Guardian was advised Release of Information must be obtained prior to any record release in order to collaborate their care with an outside provider. Patient/Guardian was advised if they have not already done so to contact the registration department to sign all necessary forms in order for us  to release information regarding their care.   Consent: Patient/Guardian gives verbal consent for treatment and assignment of benefits for services provided during this visit. Patient/Guardian expressed understanding and agreed to proceed.   Patt Steinhardt Y Kelyn Ponciano, LCSW 02/06/2023

## 2023-02-19 ENCOUNTER — Ambulatory Visit (INDEPENDENT_AMBULATORY_CARE_PROVIDER_SITE_OTHER): Payer: Medicare HMO | Admitting: Clinical

## 2023-02-19 DIAGNOSIS — F250 Schizoaffective disorder, bipolar type: Secondary | ICD-10-CM | POA: Diagnosis not present

## 2023-02-19 NOTE — Progress Notes (Signed)
   THERAPIST PROGRESS NOTE  Session Time: 25 minutes  Participation Level: Active  Behavioral Response: CasualAlertEuthymic  Type of Therapy: Individual Therapy  Treatment Goals addressed: Quame will identify situations, thoughts, and feelings that trigger internal anger, and/or angry/aggressive actions as evidenced by self-report 1x per session   ProgressTowards Goals: Progressing  Interventions: CBT  Summary:  Curvin Hunger is a 48 y.o. male who presents for the scheduled appointment oriented x 5, appropriately dressed, and friendly. Client denied hallucinations and delusions. Client reported on today he is doing well. Client reported he is settling into his new place and he feels at peace.  Client reported not too much has been going on. Client reported his doctor has him on cholesterol medication which she would like to eventually come off of. Client reported his biggest barrier is making consistent changes to what he is eating. Client reported he has a hard time committing to changes. Client reported otherwise he does have some days that he does wake up and has depressive and anxiety symptoms. Client reported its not related to any stressors. Client reported he is sleeping well with the depakote. Client reported he has some days when he wakes up with depression and anxiety.  Evidence of progress towards goal:  client PHQ9 and GAD7 is under the score of 12.    02/19/2023    8:14 AM 06/26/2022    8:11 AM 05/01/2022    8:52 AM 10/24/2021   10:14 AM  GAD 7 : Generalized Anxiety Score  Nervous, Anxious, on Edge 0 0 1 2  Control/stop worrying 0 0  2  Worry too much - different things 1 0  1  Trouble relaxing 0 0  0  Restless 0 0  1  Easily annoyed or irritable 0 0  1  Afraid - awful might happen 0 0  0  Total GAD 7 Score 1 0  7  Anxiety Difficulty Not difficult at all Not difficult at all  Somewhat difficult     Flowsheet Row Counselor from 02/19/2023 in [facility]  PHQ-9 Total Score 1        Suicidal/Homicidal: Nowithout intent/plan  Therapist Response:  Therapist began the appointment asking the client how he has been doing. Therapist used cbt to engage with active listening and positive emotional support. Therapist used cbt to engage and ask the client about any present stressors. Therapist used cbt to positively reinforce making changes for eating changes and healthy coping skills. Therapist used CBT ask the client to identify his progress with frequency of use with coping skills with continued practice in his daily activity.    Therapist assigned the client homework to call his PCP to ask for a referral to a nutritionist.  Plan: Return again in 4 weeks.  Diagnosis: schizoaffective disorder, bipolar type  Collaboration of Care: Patient refused AEB none requested by the client.  Patient/Guardian was advised Release of Information must be obtained prior to any record release in order to collaborate their care with an outside provider. Patient/Guardian was advised if they have not already done so to contact the registration department to sign all necessary forms in order for [place] to release information regarding their care.   Consent: Patient/Guardian gives verbal consent for treatment and assignment of benefits for services provided during this visit. Patient/Guardian expressed understanding and agreed to proceed.   Neena Rhymes Matt Delpizzo, LCSW 02/19/2023

## 2023-03-08 ENCOUNTER — Telehealth: Payer: Self-pay | Admitting: *Deleted

## 2023-03-08 ENCOUNTER — Ambulatory Visit (INDEPENDENT_AMBULATORY_CARE_PROVIDER_SITE_OTHER): Payer: Medicare HMO

## 2023-03-08 VITALS — Ht 73.0 in | Wt 209.0 lb

## 2023-03-08 DIAGNOSIS — Z0000 Encounter for general adult medical examination without abnormal findings: Secondary | ICD-10-CM | POA: Diagnosis not present

## 2023-03-08 DIAGNOSIS — Z1211 Encounter for screening for malignant neoplasm of colon: Secondary | ICD-10-CM

## 2023-03-08 DIAGNOSIS — Z5941 Food insecurity: Secondary | ICD-10-CM | POA: Diagnosis not present

## 2023-03-08 DIAGNOSIS — Z114 Encounter for screening for human immunodeficiency virus [HIV]: Secondary | ICD-10-CM

## 2023-03-08 DIAGNOSIS — Z1159 Encounter for screening for other viral diseases: Secondary | ICD-10-CM

## 2023-03-08 NOTE — Progress Notes (Signed)
Because this visit was a virtual/telehealth visit,  certain criteria was not obtained, such a blood pressure, CBG if applicable, and timed get up and go. Any medications not marked as "taking" were not mentioned during the medication reconciliation part of the visit. Any vitals not documented were not able to be obtained due to this being a telehealth visit or patient was unable to self-report a recent blood pressure reading due to a lack of equipment at home via telehealth. Vitals that have been documented are verbally provided by the patient.   Subjective:   Jabes Primo is a 48 y.o. male who presents for Medicare Annual/Subsequent preventive examination.  Visit Complete: Virtual I connected with  Candie Chroman on 03/08/23 by a video and audio enabled telemedicine application and verified that I am speaking with the correct person using two identifiers.  Patient Location: Home  Provider Location: Home Office  I discussed the limitations of evaluation and management by telemedicine. The patient expressed understanding and agreed to proceed.  Vital Signs: Because this visit was a virtual/telehealth visit, some criteria may be missing or patient reported. Any vitals not documented were not able to be obtained and vitals that have been documented are patient reported.  Patient Medicare AWV questionnaire was completed by the patient on 03/08/23; I have confirmed that all information answered by patient is correct and no changes since this date.  Cardiac Risk Factors include: male gender;dyslipidemia;smoking/ tobacco exposure;sedentary lifestyle     Objective:    Today's Vitals   03/08/23 0800  Weight: 209 lb (94.8 kg)  Height: 6\' 1"  (1.854 m)   Body mass index is 27.57 kg/m.     03/08/2023    8:03 AM 03/02/2022    8:28 AM 05/27/2019    2:52 PM 05/27/2019    8:44 AM 12/13/2018    9:34 AM 08/30/2018    7:30 PM 12/23/2013   11:59 AM  Advanced Directives  Does Patient Have a Medical  Advance Directive? No No No No No No No  Would patient like information on creating a medical advance directive? No - Patient declined No - Patient declined No - Patient declined No - Patient declined No - Patient declined No - Patient declined No - patient declined information    Current Medications (verified) Outpatient Encounter Medications as of 03/08/2023  Medication Sig   divalproex (DEPAKOTE ER) 500 MG 24 hr tablet Take 1 tablet (500 mg total) by mouth at bedtime.   fluticasone (FLONASE) 50 MCG/ACT nasal spray Place 1-2 sprays into both nostrils daily as needed for up to 7 days for allergies.   omega-3 acid ethyl esters (LOVAZA) 1 g capsule Take 1 capsule (1 g total) by mouth 2 (two) times daily.   simvastatin (ZOCOR) 10 MG tablet Take 1 tablet (10 mg total) by mouth every evening.   loperamide (IMODIUM) 2 MG capsule Take 1 capsule (2 mg total) by mouth 4 (four) times daily as needed for diarrhea or loose stools.   No facility-administered encounter medications on file as of 03/08/2023.    Allergies (verified) Patient has no known allergies.   History: Past Medical History:  Diagnosis Date   Bipolar 1 disorder (HCC)    under control   History of kidney stones    Sinus drainage    seasonal-spring   Past Surgical History:  Procedure Laterality Date   CIRCUMCISION  age 14   MULTIPLE EXTRACTIONS WITH ALVEOLOPLASTY N/A 12/13/2018   Procedure: MULTIPLE EXTRACTION WITH ALVEOLOPLASTY;  Surgeon:  Ocie Doyne, DDS;  Location: [facility];  Service: Oral Surgery;  Laterality: N/A;   NEPHROLITHOTOMY Right 12/23/2013   Procedure: RIGHT PERCUTANEOUS NEPHROLITHOTOMY WITH SURGEON ACCESS;  Surgeon: Crist Fat, MD;  Location: [facility] ORS;  Service: Urology;  Laterality: Right;   Family History  Problem Relation Age of Onset   Lung cancer Mother    Heart attack Father    Social History   Socioeconomic History   Marital status: Single    Spouse name: Not on file   Number of children: Not  on file   Years of education: Not on file   Highest education level: Not on file  Occupational History   Not on file  Tobacco Use   Smoking status: Every Day    Current packs/day: 1.00    Types: Cigarettes   Smokeless tobacco: Never  Vaping Use   Vaping status: Never Used  Substance and Sexual Activity   Alcohol use: Not Currently    Comment: occ   Drug use: No   Sexual activity: Not on file  Other Topics Concern   Not on file  Social History Narrative   Not on file   Social Drivers of Health   Financial Resource Strain: Medium Risk (03/08/2023)   Overall Financial Resource Strain (CARDIA)    Difficulty of Paying Living Expenses: Somewhat hard  Food Insecurity: Food Insecurity Present (03/08/2023)   Hunger Vital Sign    Worried About Running Out of Food in the Last Year: Often true    Ran Out of Food in the Last Year: Often true  Transportation Needs: No Transportation Needs (03/08/2023)   PRAPARE - Administrator, Civil Service (Medical): No    Lack of Transportation (Non-Medical): No  Physical Activity: Inactive (03/08/2023)   Exercise Vital Sign    Days of Exercise per Week: 0 days    Minutes of Exercise per Session: 0 min  Stress: No Stress Concern Present (03/08/2023)   [employer] of Occupational Health - Occupational Stress Questionnaire    Feeling of Stress : Not at all  Social Connections: Moderately Integrated (03/08/2023)   Social Connection and Isolation Panel [NHANES]    Frequency of Communication with Friends and Family: More than three times a week    Frequency of Social Gatherings with Friends and Family: More than three times a week    Attends Religious Services: More than 4 times per year    Active Member of [employer] or Organizations: Yes    Attends Engineer, structural: More than 4 times per year    Marital Status: Never married    Tobacco Counseling Ready to quit: No Counseling given: Yes   Clinical Intake:  Pre-visit  preparation completed: Yes  Pain : No/denies pain     BMI - recorded: 27.57 Nutritional Status: BMI 25 -29 Overweight Nutritional Risks: None Diabetes: No  How often do you need to have someone help you when you read instructions, pamphlets, or other written materials from your doctor or pharmacy?: 1 - Never  Interpreter Needed?: No  Information entered by :: Maryjean Ka CMA   Activities of Daily Living    03/08/2023    7:52 AM  In your present state of health, do you have any difficulty performing the following activities:  Hearing? 0  Vision? 0  Difficulty concentrating or making decisions? 0  Walking or climbing stairs? 0  Dressing or bathing? 0  Doing errands, shopping? 0  Preparing Food and eating ? N  Using the Toilet? N  In the past six months, have you accidently leaked urine? N  Do you have problems with loss of bowel control? N  Managing your Medications? N  Managing your Finances? N  Housekeeping or managing your Housekeeping? N    Patient Care Team: Ivonne Andrew, NP as PCP - General (Pulmonary Disease) [place], Neena Rhymes, LCSW as Social Worker (Licensed Clinical Social Worker) Lucia Estelle, Merlyn Albert, NP as Publishing rights manager (Psychiatry)  Indicate any recent Medical Services you may have received from other than [facility] providers in the past year (date may be approximate).     Assessment:   This is a routine wellness examination for Corwin.  Hearing/Vision screen Hearing Screening - Comments:: Patient denies any hearing difficulties.   Vision Screening - Comments:: Wears rx glasses - up to date with routine eye exams  Sees [facility]    Goals Addressed             This Visit's Progress    Patient Stated       Eat healthier       Depression Screen    03/08/2023    8:05 AM 02/19/2023    8:14 AM 09/05/2022    9:50 AM 07/10/2022    1:36 PM 06/26/2022    8:10 AM 06/07/2022   10:28 AM 05/01/2022    8:52 AM  PHQ 2/9 Scores  PHQ - 2 Score 1      0   PHQ- 9 Score 2        Exception Documentation            Information is confidential and restricted. Go to Review Flowsheets to unlock data.    Fall Risk    03/08/2023    7:52 AM 03/02/2022    8:27 AM  Fall Risk   Falls in the past year? 0 0  Number falls in past yr: 0 0  Injury with Fall? 0 0  Risk for fall due to : No Fall Risks No Fall Risks  Follow up Falls prevention discussed;Falls evaluation completed Falls prevention discussed    MEDICARE RISK AT HOME: Medicare Risk at Home Any stairs in or around the home?: (Patient-Rptd) No If so, are there any without handrails?: (Patient-Rptd) No Home free of loose throw rugs in walkways, pet beds, electrical cords, etc?: (Patient-Rptd) Yes Adequate lighting in your home to reduce risk of falls?: (Patient-Rptd) Yes Life alert?: (Patient-Rptd) No Use of a cane, walker or w/c?: (Patient-Rptd) No Grab bars in the bathroom?: (Patient-Rptd) Yes Shower chair or bench in shower?: (Patient-Rptd) No Elevated toilet seat or a handicapped toilet?: (Patient-Rptd) No  TIMED UP AND GO:  Was the test performed?  No    Cognitive Function:        03/08/2023    8:04 AM 03/02/2022    8:28 AM  6CIT Screen  What Year? 0 points 0 points  What month? 0 points 0 points  What time? 0 points 0 points  Count back from 20 0 points 0 points  Months in reverse 0 points 0 points  Repeat phrase 0 points 0 points  Total Score 0 points 0 points    Immunizations Immunization History  Administered Date(s) Administered   DTaP 01/24/2023   Influenza-Unspecified 11/18/2021   Pfizer Fall 2023 Covid-19 Vaccine 6mos thru 79yrs  11/18/2021    TDAP status: Up to date  Flu Vaccine status: Up to date  Pneumococcal vaccine status: Due, Education has been provided regarding the  importance of this vaccine. Advised may receive this vaccine at local pharmacy or Health Dept. Aware to provide a copy of the vaccination record if obtained from local pharmacy or  Health Dept. Verbalized acceptance and understanding.  Covid-19 vaccine status: Information provided on how to obtain vaccines.   Qualifies for Shingles Vaccine? No  not age appropriate for this patient  Screening Tests Health Maintenance  Topic Date Due   Pneumococcal Vaccine 36-56 Years old (1 of 2 - PCV) Never done   HIV Screening  Never done   Hepatitis C Screening  Never done   COVID-19 Vaccine (1) 11/18/2021   Colonoscopy  Never done   INFLUENZA VACCINE  08/24/2022   DTaP/Tdap/Td (2 - Tdap) 01/23/2033   HPV VACCINES  Aged Out    Health Maintenance  Health Maintenance Due  Topic Date Due   Pneumococcal Vaccine 69-65 Years old (1 of 2 - PCV) Never done   HIV Screening  Never done   Hepatitis C Screening  Never done   COVID-19 Vaccine (1) 11/18/2021   Colonoscopy  Never done   INFLUENZA VACCINE  08/24/2022    Colorectal Cancer Screening: Cologuard was ordered for patient today.    Lung Cancer Screening: (Low Dose CT Chest recommended if Age 96-80 years, 20 pack-year currently smoking OR have quit w/in 15years.) does not qualify.   Lung Cancer Screening Referral: na  Additional Screening:  Hepatitis C Screening: does qualify; Order placed for screening.   Vision Screening: Recommended annual ophthalmology exams for early detection of glaucoma and other disorders of the eye. Is the patient up to date with their annual eye exam?  Yes  Who is the provider or what is the name of the office in which the patient attends annual eye exams? [facility]  Dental Screening: Recommended annual dental exams for proper oral hygiene  Diabetic Foot Exam: na  Community Resource Referral / Chronic Care Management: CRR required this visit?  Yes   CCM required this visit?  No     Plan:     I have personally reviewed and noted the following in the patient's chart:   Medical and social history Use of alcohol, tobacco or illicit drugs  Current medications and  supplements including opioid prescriptions. Patient is not currently taking opioid prescriptions. Functional ability and status Nutritional status Physical activity Advanced directives List of other physicians Hospitalizations, surgeries, and ER visits in previous 12 months Vitals Screenings to include cognitive, depression, and falls Referrals and appointments  In addition, I have reviewed and discussed with patient certain preventive protocols, quality metrics, and best practice recommendations. A written personalized care plan for preventive services as well as general preventive health recommendations were provided to patient.     Jordan Hawks Onell Mcmath, CMA   03/08/2023   After Visit Summary: (MyChart) Due to this being a telephonic visit, the after visit summary with patients personalized plan was offered to patient via MyChart   Nurse Notes: see routing comment

## 2023-03-08 NOTE — Patient Instructions (Signed)
[name] , Thank you for taking time to come for your Medicare Wellness Visit. I appreciate your ongoing commitment to your health goals. Please review the following plan we discussed and let me know if I can assist you in the future.   Referrals/Orders/Follow-Ups/Clinician Recommendations:  Next Medicare Annual Wellness Visit: March 13, 2024 at 8:00 am video visit  A referral has been placed for you to see if there are any additional resources to help you with one of the following:     []   Transportation Needs   []   Utility Needs   [x]   Food Insecurity   []  Assistance with daily activities such as bath, dressing, and managing your medications   []  Housing Insecurity   []  Assistance with your medications  If you haven't heard from anyone within the next 7 business days, please call them and let them know a referral has been placed  Concierge Line: [phone]  A Hepatitis C Screening and HIV screening has been ordered for you today. You do not have to fast to have this lab drawn. Have these drawn at the same lab you have your routine labs drawn for your pcp.     This is a list of the screening recommended for you and due dates:  Health Maintenance  Topic Date Due   Pneumococcal Vaccination (1 of 2 - PCV) Never done   Hepatitis C Screening  Never done   COVID-19 Vaccine (1) 11/18/2021   Colon Cancer Screening  Never done   DTaP/Tdap/Td vaccine (2 - Tdap) 01/23/2033   Flu Shot  Completed   HIV Screening  Completed   HPV Vaccine  Aged Out    Advanced directives: (Declined) Advance directive discussed with you today. Even though you declined this today, please call our office should you change your mind, and we can give you the proper paperwork for you to fill out.  Next Medicare Annual Wellness Visit scheduled for next year: yes  Preventive Care 15-65 Years Old, Male Preventive care refers to lifestyle choices and visits with your health care provider that can promote  health and wellness. Preventive care visits are also called wellness exams. What can I expect for my preventive care visit? Counseling During your preventive care visit, your health care provider may ask about your: Medical history, including: Past medical problems. Family medical history. Current health, including: Emotional well-being. Home life and relationship well-being. Sexual activity. Lifestyle, including: Alcohol, nicotine or tobacco, and drug use. Access to firearms. Diet, exercise, and sleep habits. Safety issues such as seatbelt and bike helmet use. Sunscreen use. Work and work Astronomer. Physical exam Your health care provider may check your: Height and weight. These may be used to calculate your BMI (body mass index). BMI is a measurement that tells if you are at a healthy weight. Waist circumference. This measures the distance around your waistline. This measurement also tells if you are at a healthy weight and may help predict your risk of certain diseases, such as type 2 diabetes and high blood pressure. Heart rate and blood pressure. Body temperature. Skin for abnormal spots. What immunizations do I need?  Vaccines are usually given at various ages, according to a schedule. Your health care provider will recommend vaccines for you based on your age, medical history, and lifestyle or other factors, such as travel or where you work. What tests do I need? Screening Your health care provider may recommend screening tests for certain conditions. This may include: Lipid and cholesterol  levels. Diabetes screening. This is done by checking your blood sugar (glucose) after you have not eaten for a while (fasting). Hepatitis B test. Hepatitis C test. HIV (human immunodeficiency virus) test. STI (sexually transmitted infection) testing, if you are at risk. Talk with your health care provider about your test results, treatment options, and if necessary, the need for more  tests. Follow these instructions at home: Eating and drinking  Eat a healthy diet that includes fresh fruits and vegetables, whole grains, lean protein, and low-fat dairy products. Drink enough fluid to keep your urine pale yellow. Take vitamin and mineral supplements as recommended by your health care provider. Do not drink alcohol if your health care provider tells you not to drink. If you drink alcohol: Limit how much you have to 0-2 drinks a day. Know how much alcohol is in your drink. In the [place] one drink equals one 12 oz bottle of beer (355 mL), one 5 oz glass of wine (148 mL), or one 1 oz glass of hard liquor (44 mL). Lifestyle Brush your teeth every morning and night with fluoride toothpaste. Floss one time each day. Exercise for at least 30 minutes 5 or more days each week. Do not use any products that contain nicotine or tobacco. These products include cigarettes, chewing tobacco, and vaping devices, such as e-cigarettes. If you need help quitting, ask your health care provider. Do not use drugs. If you are sexually active, practice safe sex. Use a condom or other form of protection to prevent STIs. Find healthy ways to manage stress, such as: Meditation, yoga, or listening to music. Journaling. Talking to a trusted person. Spending time with friends and family. Minimize exposure to UV radiation to reduce your risk of skin cancer. Safety Always wear your seat belt while driving or riding in a vehicle. Do not drive: If you have been drinking alcohol. Do not ride with someone who has been drinking. If you have been using any mind-altering substances or drugs. While texting. When you are tired or distracted. Wear a helmet and other protective equipment during sports activities. If you have firearms in your house, make sure you follow all gun safety procedures. Seek help if you have been physically or sexually abused. What's next? Go to your health care provider once a year  for an annual wellness visit. Ask your health care provider how often you should have your eyes and teeth checked. Stay up to date on all vaccines. This information is not intended to replace advice given to you by your health care provider. Make sure you discuss any questions you have with your health care provider. Document Revised: 07/07/2020 Document Reviewed: 07/07/2020 Elsevier Patient Education  2024 [employer].  Understanding Your Risk for Falls Millions of people have serious injuries from falls each year. It is important to understand your risk of falling. Talk with your health care provider about your risk and what you can do to lower it. If you do have a serious fall, make sure to tell your provider. Falling once raises your risk of falling again. How can falls affect me? Serious injuries from falls are common. These include: Broken bones, such as hip fractures. Head injuries, such as traumatic brain injuries (TBI) or concussions. A fear of falling can cause you to avoid activities and stay at home. This can make your muscles weaker and raise your risk for a fall. What can increase my risk? There are a number of risk factors that increase your risk  for falling. The more risk factors you have, the higher your risk of falling. Serious injuries from a fall happen most often to people who are older than 46 years old. Teenagers and young adults ages 25-29 are also at higher risk. Common risk factors include: Weakness in the lower body. Being generally weak or confused due to long-term (chronic) illness. Dizziness or balance problems. Poor vision. Medicines that cause dizziness or drowsiness. These may include: Medicines for your blood pressure, heart, anxiety, insomnia, or swelling (edema). Pain medicines. Muscle relaxants. Other risk factors include: Drinking alcohol. Having had a fall in the past. Having foot pain or wearing improper footwear. Working at a dangerous  job. Having any of the following in your home: Tripping hazards, such as floor clutter or loose rugs. Poor lighting. Pets. Having dementia or memory loss. What actions can I take to lower my risk of falling?     Physical activity Stay physically fit. Do strength and balance exercises. Consider taking a regular class to build strength and balance. Yoga and tai chi are good options. Vision Have your eyes checked every year and your prescription for glasses or contacts updated as needed. Shoes and walking aids Wear non-skid shoes. Wear shoes that have rubber soles and low heels. Do not wear high heels. Do not walk around the house in socks or slippers. Use a cane or walker as told by your provider. Home safety Attach secure railings on both sides of your stairs. Install grab bars for your bathtub, shower, and toilet. Use a non-skid mat in your bathtub or shower. Attach bath mats securely with double-sided, non-slip rug tape. Use good lighting in all rooms. Keep a flashlight near your bed. Make sure there is a clear path from your bed to the bathroom. Use night-lights. Do not use throw rugs. Make sure all carpeting is taped or tacked down securely. Remove all clutter from walkways and stairways, including extension cords. Repair uneven or broken steps and floors. Avoid walking on icy or slippery surfaces. Walk on the grass instead of on icy or slick sidewalks. Use ice melter to get rid of ice on walkways in the winter. Use a cordless phone. Questions to ask your health care provider Can you help me check my risk for a fall? Do any of my medicines make me more likely to fall? Should I take a vitamin D supplement? What exercises can I do to improve my strength and balance? Should I make an appointment to have my vision checked? Do I need a bone density test to check for weak bones (osteoporosis)? Would it help to use a cane or a walker? Where to find more information Centers for  Disease Control and Prevention, [employer]: TonerPromos.no Community-Based Fall Prevention Programs: TonerPromos.no [employer] on Aging: BaseRingTones.pl Contact a health care provider if: You fall at home. You are afraid of falling at home. You feel weak, drowsy, or dizzy. This information is not intended to replace advice given to you by your health care provider. Make sure you discuss any questions you have with your health care provider. Document Revised: 09/12/2021 Document Reviewed: 09/12/2021 Elsevier Patient Education  2024 [employer].

## 2023-03-08 NOTE — Progress Notes (Signed)
Complex Care Management Note  Care Guide Note 03/08/2023 Name: Randall [name] MRN: [id number] DOB: Oct 15, 1975  [name] is a 48 y.o. year old male who sees Ivonne Andrew, NP for primary care. I reached out to Candie Chroman by phone today to offer complex care management services.  [name] was given information about Complex Care Management services today including:   The Complex Care Management services include support from the care team which includes your Nurse Care Manager, Clinical Social Worker, or Pharmacist.  The Complex Care Management team is here to help remove barriers to the health concerns and goals most important to you. Complex Care Management services are voluntary, and the patient may decline or stop services at any time by request to their care team member.   Complex Care Management Consent Status: Patient agreed to services and verbal consent obtained.   Follow up plan:  Telephone appointment with complex care management team member scheduled for:  03/23/2023  Encounter Outcome:  Patient Scheduled  Burman Nieves, CMA, Care Guide [facility]  [facility], [facility] Guide Direct Dial: [phone]  Fax: [phone] Website: [email or web address]

## 2023-03-08 NOTE — Progress Notes (Signed)
Complex Care Management Note Care Guide Note  03/08/2023 Name: Randall [name] MRN: [id number] DOB: Dec 22, 1975   Complex Care Management Outreach Attempts: An unsuccessful telephone outreach was attempted today to offer the patient information about available complex care management services.  Follow Up Plan:  Additional outreach attempts will be made to offer the patient complex care management information and services.   Encounter Outcome:  No Answer  Burman Nieves, CMA, Care Guide [facility]  [facility], [facility] Guide Direct Dial: [phone]  Fax: [phone] Website: [email or web address]

## 2023-03-13 ENCOUNTER — Ambulatory Visit (INDEPENDENT_AMBULATORY_CARE_PROVIDER_SITE_OTHER): Payer: Medicare HMO | Admitting: Clinical

## 2023-03-13 DIAGNOSIS — F250 Schizoaffective disorder, bipolar type: Secondary | ICD-10-CM | POA: Diagnosis not present

## 2023-03-13 NOTE — Progress Notes (Unsigned)
   THERAPIST PROGRESS NOTE  Session Time: 25 minutes  Participation Level: Active  Behavioral Response: CasualAlertEuthymic  Type of Therapy: Individual Therapy  Treatment Goals addressed:   ProgressTowards Goals: Progressing  Interventions: CBT  Summary:  Chia Rock is a 48 y.o. male who presents for the scheduled appointment oriented times five, appropriately dressed and friendly. Client denied hallucinations and delusions.    Suicidal/Homicidal: {BHH YES OR NO:22294}{yes/no/with/without intent/plan:22693}  Therapist Response: ***  Plan: Return again in *** weeks.  Diagnosis: No diagnosis found.  Collaboration of Care: {BH OP Collaboration of Care:21014065}  Patient/Guardian was advised Release of Information must be obtained prior to any record release in order to collaborate their care with an outside provider. Patient/Guardian was advised if they have not already done so to contact the registration department to sign all necessary forms in order for [place] to release information regarding their care.   Consent: Patient/Guardian gives verbal consent for treatment and assignment of benefits for services provided during this visit. Patient/Guardian expressed understanding and agreed to proceed.   Neena Rhymes Aaronjames Kelsay, LCSW 03/13/2023

## 2023-03-16 ENCOUNTER — Other Ambulatory Visit: Payer: Self-pay | Admitting: Nurse Practitioner

## 2023-03-16 MED ORDER — OMEGA-3-ACID ETHYL ESTERS 1 G PO CAPS: 1.0000 g | ORAL_CAPSULE | Freq: Two times a day (BID) | ORAL | 2 refills | Status: DC

## 2023-03-16 NOTE — Telephone Encounter (Signed)
Copied from CRM [id number]. Topic: Clinical - Medication Refill >> Mar 16, 2023 11:52 AM Elle L wrote: Most Recent Primary Care Visit:  Provider: Recardo Evangelist A  Department: [facility]  Visit Type: MEDICARE AWV, SEQUENTIAL  Date: 03/08/2023  Medication: omega-3 acid ethyl esters (LOVAZA) 1 g capsule  Has the patient contacted their pharmacy? Yes  Is this the correct pharmacy for this prescription? Yes  [facility] - [place], [place] - [address] [place] [address] Phone: [phone] Fax: [phone]   Has the prescription been filled recently? No  Is the patient out of the medication? Yes  Has the patient been seen for an appointment in the last year OR does the patient have an upcoming appointment? No  Can we respond through MyChart? Yes  Agent: Please be advised that Rx refills may take up to 3 business days. We ask that you follow-up with your pharmacy.

## 2023-03-23 ENCOUNTER — Ambulatory Visit: Payer: Self-pay | Admitting: Licensed Clinical Social Worker

## 2023-03-23 NOTE — Patient Instructions (Signed)
 Visit Information  Thank you for taking time to visit with me today. Please don't hesitate to contact me if I can be of assistance to you.   Following are the goals we discussed today:   Goals Addressed             This Visit's Progress    COMPLETED: Care Coordination       Care Coordination Interventions: Assessed Social Determinants of Health Reviewed all upcoming appointments in Epic system Motivational Interviewing employed Solution-Focused Strategies employed:  Active listening / Reflection utilized   [employer] options (Will send list of food banks in the area) Problem Solving /Task Center strategies reviewed           Please call the care guide team at [phone] if you need to cancel or reschedule your appointment.   If you are experiencing a Mental Health or Behavioral Health Crisis or need someone to talk to, please call the Suicide and Crisis Lifeline: 988  Patient verbalizes understanding of instructions and care plan provided today and agrees to view in MyChart. Active MyChart status and patient understanding of how to access instructions and care plan via MyChart confirmed with patient.     No further follow up required: Please contact your provider if you would like to reconnect.

## 2023-03-23 NOTE — Patient Outreach (Signed)
 Care Coordination   Initial Visit Note   03/23/2023 Name: Jordie Schreur MRN: [id number] DOB: 10-23-1975  Huntley Estelle Lesinski is a 48 y.o. year old male who sees Ivonne Andrew, NP for primary care. I spoke with  Candie Chroman by phone today.  What matters to the patients health and wellness today?  Increasing access to community resources.    Goals Addressed             This Visit's Progress    COMPLETED: Care Coordination       Care Coordination Interventions: Assessed Social Determinants of Health Reviewed all upcoming appointments in Epic system Motivational Interviewing employed Solution-Focused Strategies employed:  Active listening / Reflection utilized   [employer] options (Will send list of food banks in the area) Problem Solving /Task Center strategies reviewed          SDOH assessments and interventions completed:  Yes  SDOH Interventions Today    Flowsheet Row Most Recent Value  SDOH Interventions   Food Insecurity Interventions Community Resources Provided  [Will send list of community food banks.]  Housing Interventions Intervention Not Indicated  Transportation Interventions Intervention Not Indicated  Utilities Interventions Intervention Not Indicated        Care Coordination Interventions:  Yes, provided   Interventions Today    Flowsheet Row Most Recent Value  Chronic Disease   Chronic disease during today's visit Other  [SDOH]  General Interventions   General Interventions Discussed/Reviewed [employer], General Interventions Discussed  [Pt reported that his food stamps were decreased when his diasbility benfits changed. Pt haschild support he has to pay which reduces his usable funds. CSW and pt explored food banks in the area, CSW will send list]  Mental Health Interventions   Mental Health Discussed/Reviewed Mental Health Discussed  [CSW and pt discussed his counseling sessions/MH management. Pt reported that counseling  was going well and his MH is well managed at this time. Pt denied any needs.]        Follow up plan: No further intervention required.   Encounter Outcome:  Patient Visit Completed   Kenton Kingfisher, LCSW [facility] Licensed Clinical Social Worker Care Coordinator [phone]

## 2023-03-27 ENCOUNTER — Ambulatory Visit (HOSPITAL_COMMUNITY): Payer: Medicare HMO | Admitting: Student

## 2023-03-27 DIAGNOSIS — F3340 Major depressive disorder, recurrent, in remission, unspecified: Secondary | ICD-10-CM

## 2023-03-27 DIAGNOSIS — R454 Irritability and anger: Secondary | ICD-10-CM

## 2023-03-27 DIAGNOSIS — F1011 Alcohol abuse, in remission: Secondary | ICD-10-CM

## 2023-03-27 DIAGNOSIS — F1211 Cannabis abuse, in remission: Secondary | ICD-10-CM

## 2023-03-27 DIAGNOSIS — Z79899 Other long term (current) drug therapy: Secondary | ICD-10-CM

## 2023-03-27 MED ORDER — DIVALPROEX SODIUM ER 500 MG PO TB24: 1000.0000 mg | ORAL_TABLET | Freq: Every day | ORAL | 2 refills | Status: DC

## 2023-03-29 NOTE — Progress Notes (Signed)
 BH MD Outpatient Progress Note  03/29/2023 1:34 PM Randall [name]  MRN:  [id number]  Assessment:  Randall [name] presents for follow-up evaluation.  Today he reports that he is doing well aside from difficulty controlling his anger when he gets upset with his partner.  He denies physical violence but says that their frequent verbal altercations bother him.  He is interested in a medication change.  We discussed that Depakote, which he is already on, is helpful for irritability and agitation.  He was amenable to increasing the dose to 1000 mg nightly.  Therapy will of course be helpful for helping the patient manage his anger better.  The patient has demonstrated psychiatric stability over the past few appointments.  I do not see a justification for his diagnosis of schizoaffective disorder, which would likely not be treated well without an antipsychotic medication.  Regarding his history of substance use, the patient reports that he has not abused alcohol or marijuana since 04/07/2011.  We will change his diagnosis to MDD in remission and history of cannabis and alcohol abuse in remission.  Plan to further discuss history of manic episodes at the next appointment.  Identifying Information: Randall [name] is a 48 y.o. y.o. male with a history of schizoaffective disorder bipolar type who is an established patient with [facility] for management of the aforementioned diagnosis.   Plan:  # MDD in remission, cannabis and alcohol abuse in remission  difficulty controlling anger, agitation Interventions: -- Increase Depakote ER from 500 mg nightly to 1000 mg nightly - VPA level of 40, platelets within normal limits 11/22/2022 - Check Depakote level in 2 to 3 weeks, scheduled for afternoon lab draw - Continue therapy with Paige  Patient was given contact information for behavioral health clinic and was instructed to call 911 for emergencies.   Subjective:  Chief Complaint:   Chief Complaint  Patient presents with   Follow-up    Interval History:  In addition to the above information, the patient reports that his daily functioning has been good.  He denies experiencing any hallucinations or paranoia.  He reports a euthymic mood, consistent and good sleep.  He reports a good appetite.  He denies experiencing any suicidal thoughts.  He denies medication side effects.   Visit Diagnosis:    ICD-10-CM   1. Difficulty controlling anger  R45.4 divalproex (DEPAKOTE ER) 500 MG 24 hr tablet    2. High risk medication use  Z79.899 CBC with Differential    Comprehensive Metabolic Panel (CMET)    Valproic Acid level    3. MDD (recurrent major depressive disorder) in remission (HCC)  F33.40     4. Cannabis abuse, in remission  F12.11     5. Alcohol abuse, in remission  F10.11       Past Psychiatric History:  The patient denies any history of self-harm behavior, including suicide attempts.  He reports 1 psychiatric admission occurring in 2001-04-06, after the death of his mother.  Patient has previously tried risperidone, but may have been experiencing akathisia.  Past Medical History:  Past Medical History:  Diagnosis Date   Bipolar 1 disorder (HCC)    under control   History of kidney stones    Sinus drainage    seasonal-spring    Past Surgical History:  Procedure Laterality Date   CIRCUMCISION  age 60   MULTIPLE EXTRACTIONS WITH ALVEOLOPLASTY N/A 12/13/2018   Procedure: MULTIPLE EXTRACTION WITH ALVEOLOPLASTY;  Surgeon: Ocie Doyne, DDS;  Location: [facility] OR;  Service: Oral Surgery;  Laterality: N/A;   NEPHROLITHOTOMY Right 12/23/2013   Procedure: RIGHT PERCUTANEOUS NEPHROLITHOTOMY WITH SURGEON ACCESS;  Surgeon: Crist Fat, MD;  Location: [facility];  Service: Urology;  Laterality: Right;    Family Psychiatric History: None pertinent  Family History:  Family History  Problem Relation Age of Onset   Lung cancer Mother    Heart attack Father     Social  History:  Social History   Socioeconomic History   Marital status: Single    Spouse name: Not on file   Number of children: Not on file   Years of education: Not on file   Highest education level: Not on file  Occupational History   Not on file  Tobacco Use   Smoking status: Every Day    Current packs/day: 1.00    Types: Cigarettes   Smokeless tobacco: Never  Vaping Use   Vaping status: Never Used  Substance and Sexual Activity   Alcohol use: Not Currently    Comment: occ   Drug use: No   Sexual activity: Not on file  Other Topics Concern   Not on file  Social History Narrative   Not on file   Social Drivers of Health   Financial Resource Strain: Medium Risk (03/08/2023)   Overall Financial Resource Strain (CARDIA)    Difficulty of Paying Living Expenses: Somewhat hard  Food Insecurity: Food Insecurity Present (03/23/2023)   Hunger Vital Sign    Worried About Running Out of Food in the Last Year: Often true    Ran Out of Food in the Last Year: Never true  Transportation Needs: No Transportation Needs (03/23/2023)   PRAPARE - Administrator, Civil Service (Medical): No    Lack of Transportation (Non-Medical): No  Physical Activity: Inactive (03/08/2023)   Exercise Vital Sign    Days of Exercise per Week: 0 days    Minutes of Exercise per Session: 0 min  Stress: No Stress Concern Present (03/08/2023)   [employer] of Occupational Health - Occupational Stress Questionnaire    Feeling of Stress : Not at all  Social Connections: Moderately Integrated (03/08/2023)   Social Connection and Isolation Panel [NHANES]    Frequency of Communication with Friends and Family: More than three times a week    Frequency of Social Gatherings with Friends and Family: More than three times a week    Attends Religious Services: More than 4 times per year    Active Member of [employer] or Organizations: Yes    Attends Engineer, structural: More than 4 times per year     Marital Status: Never married    Allergies: No Known Allergies  Current Medications: Current Outpatient Medications  Medication Sig Dispense Refill   divalproex (DEPAKOTE ER) 500 MG 24 hr tablet Take 2 tablets (1,000 mg total) by mouth at bedtime. 60 tablet 2   fluticasone (FLONASE) 50 MCG/ACT nasal spray Place 1-2 sprays into both nostrils daily as needed for up to 7 days for allergies. 16 g 0   loperamide (IMODIUM) 2 MG capsule Take 1 capsule (2 mg total) by mouth 4 (four) times daily as needed for diarrhea or loose stools. 12 capsule 0   omega-3 acid ethyl esters (LOVAZA) 1 g capsule Take 1 capsule (1 g total) by mouth 2 (two) times daily. 60 capsule 2   simvastatin (ZOCOR) 10 MG tablet Take 1 tablet (10 mg total) by mouth every evening. 30 tablet 1  No current facility-administered medications for this visit.     Objective:  Psychiatric Specialty Exam: Physical Exam Constitutional:      Appearance: the patient is not toxic-appearing.  Pulmonary:     Effort: Pulmonary effort is normal.  Neurological:     General: No focal deficit present.     Mental Status: the patient is alert and oriented to person, place, and time.   Review of Systems  Respiratory:  Negative for shortness of breath.   Cardiovascular:  Negative for chest pain.  Gastrointestinal:  Negative for abdominal pain, constipation, diarrhea, nausea and vomiting.  Neurological:  Negative for headaches.      There were no vitals taken for this visit.  General Appearance: Fairly Groomed  Eye Contact:  Good  Speech:  Clear and Coherent  Volume:  Normal  Mood: "Okay"  Affect:  Congruent, appropriate  Thought Process:  Coherent  Orientation:  Full (Time, Place, and Person)  Thought Content: Logical   Suicidal Thoughts:  No  Homicidal Thoughts:  No  Memory:  Immediate;   Good  Judgement:  fair  Insight:  fair  Psychomotor Activity:  Normal  Concentration:  Concentration: Good  Recall:  Good  Fund of  Knowledge: Good  Language: Good  Akathisia:  No  Handed:    AIMS (if indicated): not done  Assets:  Communication Skills Desire for Improvement Financial Resources/Insurance Housing Leisure Time Physical Health  ADL's:  Intact  Cognition: WNL  Sleep:  Fair     Metabolic Disorder Labs: Lab Results  Component Value Date   HGBA1C 6.0 (H) 06/07/2022   No results found for: "PROLACTIN" Lab Results  Component Value Date   CHOL 185 06/07/2022   TRIG 177 (H) 06/07/2022   HDL 30 (L) 06/07/2022   CHOLHDL 6.2 (H) 06/07/2022   LDLCALC 123 (H) 06/07/2022   LDLCALC 170 (H) 12/07/2021   Lab Results  Component Value Date   TSH 0.943 12/07/2021    Therapeutic Level Labs: No results found for: "LITHIUM" Lab Results  Component Value Date   VALPROATE 40 (L) 11/22/2022   VALPROATE 52 02/21/2022   No results found for: "CBMZ"  Screenings: AIMS    Flowsheet Row Clinical Support [facility] 10/24/2021 in [facility]  AIMS Total Score 0      GAD-7    Flowsheet Row Counselor from 02/19/2023 in [facility] Counselor from 06/26/2022 in [facility] Clinical Support from 10/24/2021 in [facility] Video Visit from 02/22/2021 in [facility] Office Visit from 11/01/2020 in [facility]  Total GAD-7 Score 1 0 7 8 11       PHQ2-9    Flowsheet Row Clinical Support from 03/08/2023 in [facility] Counselor from 02/19/2023 in [facility] Counselor from 09/05/2022 in [facility] Clinical Support from 07/10/2022 in [facility] Counselor from 06/26/2022 in [facility]  PHQ-2 Total Score 1 1 1 2 1   PHQ-9 Total Score 2 1 2 5 1       Flowsheet Row Counselor from  02/19/2023 in [facility] ED from 06/02/2022 in [facility] from 01/03/2022 in [facility]  C-SSRS RISK CATEGORY No Risk No Risk No Risk       Collaboration of  Care: none  A total of 30 minutes was spent involved in face to face clinical care, chart review, documentation.   Carlyn Reichert, MD 03/29/2023, 1:34 PM

## 2023-04-04 ENCOUNTER — Other Ambulatory Visit: Payer: Self-pay | Admitting: Nurse Practitioner

## 2023-04-04 ENCOUNTER — Telehealth: Payer: Self-pay

## 2023-04-04 DIAGNOSIS — R030 Elevated blood-pressure reading, without diagnosis of hypertension: Secondary | ICD-10-CM | POA: Diagnosis not present

## 2023-04-04 DIAGNOSIS — F259 Schizoaffective disorder, unspecified: Secondary | ICD-10-CM | POA: Diagnosis not present

## 2023-04-04 DIAGNOSIS — Z8249 Family history of ischemic heart disease and other diseases of the circulatory system: Secondary | ICD-10-CM | POA: Diagnosis not present

## 2023-04-04 DIAGNOSIS — Z720 Tobacco use: Secondary | ICD-10-CM | POA: Diagnosis not present

## 2023-04-04 DIAGNOSIS — Z008 Encounter for other general examination: Secondary | ICD-10-CM | POA: Diagnosis not present

## 2023-04-04 DIAGNOSIS — E785 Hyperlipidemia, unspecified: Secondary | ICD-10-CM | POA: Diagnosis not present

## 2023-04-04 DIAGNOSIS — F1221 Cannabis dependence, in remission: Secondary | ICD-10-CM | POA: Diagnosis not present

## 2023-04-04 DIAGNOSIS — F1021 Alcohol dependence, in remission: Secondary | ICD-10-CM | POA: Diagnosis not present

## 2023-04-04 DIAGNOSIS — F320 Major depressive disorder, single episode, mild: Secondary | ICD-10-CM | POA: Diagnosis not present

## 2023-04-04 DIAGNOSIS — Z833 Family history of diabetes mellitus: Secondary | ICD-10-CM | POA: Diagnosis not present

## 2023-04-04 DIAGNOSIS — J309 Allergic rhinitis, unspecified: Secondary | ICD-10-CM | POA: Diagnosis not present

## 2023-04-04 DIAGNOSIS — Z79899 Other long term (current) drug therapy: Secondary | ICD-10-CM | POA: Diagnosis not present

## 2023-04-04 DIAGNOSIS — Z801 Family history of malignant neoplasm of trachea, bronchus and lung: Secondary | ICD-10-CM | POA: Diagnosis not present

## 2023-04-04 MED ORDER — OMEGA-3-ACID ETHYL ESTERS 1 G PO CAPS: 1.0000 g | ORAL_CAPSULE | Freq: Two times a day (BID) | ORAL | 2 refills | Status: DC

## 2023-04-04 NOTE — Telephone Encounter (Unsigned)
 Copied from CRM [id number]. Topic: Clinical - Prescription Issue >> Apr 04, 2023  9:30 AM Ivette P wrote: Reason for CRM: Pt called in about medication omega-3 acid ethyl esters (LOVAZA) 1 g capsule. Pt called in on 03/16/2023 for a refill and medication was sent in to pharmacy same day. Pt states he has not received medication. When sent to [facility], the medication should be mailed to pt but pt states he did not receive. Please call pt back [phone]   Pharmacy: [facility] - [place], [place] - [address] [place] [address] Phone: [phone]  Fax: [phone]  Confirmation: Sig - Route: Take 1 capsule (1 g total) by mouth 2 (two) times daily. - Oral Sent to pharmacy as: omega-3 acid ethyl esters (LOVAZA) 1 g capsule E-Prescribing Status: Receipt confirmed by pharmacy (03/16/2023  4:51 PM EST)    Pt would like a call, if unable to send to this pharmacy, pt would like it sent to [facility], please call pt to confirm details. [phone]

## 2023-04-05 ENCOUNTER — Other Ambulatory Visit: Payer: Self-pay | Admitting: Nurse Practitioner

## 2023-04-05 MED ORDER — OMEGA-3-ACID ETHYL ESTERS 1 G PO CAPS: 1.0000 g | ORAL_CAPSULE | Freq: Two times a day (BID) | ORAL | 2 refills | Status: DC

## 2023-04-06 ENCOUNTER — Other Ambulatory Visit: Payer: Self-pay | Admitting: Nurse Practitioner

## 2023-04-06 MED ORDER — OMEGA-3-ACID ETHYL ESTERS 1 G PO CAPS: 1.0000 g | ORAL_CAPSULE | Freq: Two times a day (BID) | ORAL | 2 refills | Status: DC

## 2023-04-09 ENCOUNTER — Other Ambulatory Visit (INDEPENDENT_AMBULATORY_CARE_PROVIDER_SITE_OTHER)

## 2023-04-09 DIAGNOSIS — F3340 Major depressive disorder, recurrent, in remission, unspecified: Secondary | ICD-10-CM

## 2023-04-09 DIAGNOSIS — Z79899 Other long term (current) drug therapy: Secondary | ICD-10-CM | POA: Diagnosis not present

## 2023-04-10 ENCOUNTER — Ambulatory Visit (INDEPENDENT_AMBULATORY_CARE_PROVIDER_SITE_OTHER): Payer: Medicare HMO | Admitting: Clinical

## 2023-04-10 DIAGNOSIS — F411 Generalized anxiety disorder: Secondary | ICD-10-CM

## 2023-04-10 NOTE — Progress Notes (Signed)
   THERAPIST PROGRESS NOTE  Session Time: 30 minutes  Participation Level: Active  Behavioral Response: CasualAlertEuthymic  Type of Therapy: Individual Therapy  Treatment Goals addressed: Alastair will identify situations, thoughts, and feelings that trigger internal anger, and/or angry/aggressive actions as evidenced by self-report 1x per session   ProgressTowards Goals: Progressing  Interventions: CBT and Supportive  Summary:  Linn Goetze is a 48 y.o. male who presents for the scheduled appointment oriented times five, appropriately dressed and friendly. Client denied hallucinations and delusions. Client reported on today he is fairly well but he has had some irritations.  Client reported the psychiatrist increased his medication to help with the anxiety and irritability.  Client reported he recently did blood work for that.  Client reported one of his stressors recently has been his relationship.  Client reported she is older than him but she keeps showing signs that she feels insecure in their relationship because of the comments that people make to her about how they look together.  Client reported he just decided to give her space.  Client reported he has had good guidance from his pastor and a Aldona Lento at his church was also a Dance movement psychotherapist to him.  Client reported they also help reinforce to him keeping boundaries from things that cause him stress and anger.  Client reported he recently had a gas leak at his home which she quickly called the gas company [employer] to come fix it. Client reported otherwise he is keeping up with his health appointments.  Client reported in positive news that his son will be graduating in May. Evidence of progress towards goal: Client reported to positives of medication compliant 7 days a week.  Client also reported practicing at least 1 skill of physical boundaries from people who caused him to feel negatively.  Suicidal/Homicidal: Nowithout  intent/plan  Therapist Response:  Therapist began the appointment asking the client how he has been doing since last seen. Therapist used CBT to engage using active listening and positive emotional support. Therapist used CBT to engage and asked the client about changes that have occurred since he was last seen. Therapist used CBT to positively reinforce his medication compliant and being connected with positive social supports that help him work through stressors. Therapist used CBT ask the client to identify his progress with frequency of use with coping skills with continued practice in his daily activity.    Therapist assigned client homework prior to self-care.  Plan: Return again in 4 weeks.  Diagnosis: GAD  Collaboration of Care: Patient refused AEB none requested by the client.  Patient/Guardian was advised Release of Information must be obtained prior to any record release in order to collaborate their care with an outside provider. Patient/Guardian was advised if they have not already done so to contact the registration department to sign all necessary forms in order for [place] to release information regarding their care.   Consent: Patient/Guardian gives verbal consent for treatment and assignment of benefits for services provided during this visit. Patient/Guardian expressed understanding and agreed to proceed.   Neena Rhymes Tranisha Tissue, LCSW 04/10/2023

## 2023-04-11 LAB — COMPREHENSIVE METABOLIC PANEL
ALT: 14 IU/L (ref 0–44)
AST: 21 IU/L (ref 0–40)
Albumin: 4.8 g/dL (ref 4.1–5.1)
Alkaline Phosphatase: 70 IU/L (ref 44–121)
BUN/Creatinine Ratio: 9 (ref 9–20)
BUN: 9 mg/dL (ref 6–24)
Bilirubin Total: 0.5 mg/dL (ref 0.0–1.2)
CO2: 24 mmol/L (ref 20–29)
Calcium: 9.6 mg/dL (ref 8.7–10.2)
Chloride: 103 mmol/L (ref 96–106)
Creatinine, Ser: 0.98 mg/dL (ref 0.76–1.27)
Globulin, Total: 3.1 g/dL (ref 1.5–4.5)
Glucose: 87 mg/dL (ref 70–99)
Potassium: 4.8 mmol/L (ref 3.5–5.2)
Sodium: 141 mmol/L (ref 134–144)
Total Protein: 7.9 g/dL (ref 6.0–8.5)
eGFR: 96 mL/min/{1.73_m2} (ref >59)

## 2023-04-11 LAB — CBC WITH DIFFERENTIAL/PLATELET
Basophils Absolute: 0 10*3/uL (ref 0.0–0.2)
Basos: 0 %
EOS (ABSOLUTE): 0.1 10*3/uL (ref 0.0–0.4)
Eos: 1 %
Hematocrit: 49 % (ref 37.5–51.0)
Hemoglobin: 16 g/dL (ref 13.0–17.7)
Immature Grans (Abs): 0 10*3/uL (ref 0.0–0.1)
Immature Granulocytes: 0 %
Lymphocytes Absolute: 2.8 10*3/uL (ref 0.7–3.1)
Lymphs: 40 %
MCH: 29.4 pg (ref 26.6–33.0)
MCHC: 32.7 g/dL (ref 31.5–35.7)
MCV: 90 fL (ref 79–97)
Monocytes Absolute: 0.6 10*3/uL (ref 0.1–0.9)
Monocytes: 8 %
Neutrophils Absolute: 3.6 10*3/uL (ref 1.4–7.0)
Neutrophils: 51 %
Platelets: 230 10*3/uL (ref 150–450)
RBC: 5.44 x10E6/uL (ref 4.14–5.80)
RDW: 12.8 % (ref 11.6–15.4)
WBC: 7.1 10*3/uL (ref 3.4–10.8)

## 2023-04-11 LAB — VALPROIC ACID LEVEL: Valproic Acid Lvl: 78 ug/mL (ref 50–100)

## 2023-05-07 ENCOUNTER — Ambulatory Visit (INDEPENDENT_AMBULATORY_CARE_PROVIDER_SITE_OTHER): Payer: Medicare HMO | Admitting: Clinical

## 2023-05-07 DIAGNOSIS — F3340 Major depressive disorder, recurrent, in remission, unspecified: Secondary | ICD-10-CM

## 2023-05-07 NOTE — Progress Notes (Unsigned)
   THERAPIST PROGRESS NOTE  Session Time: 25 minutes  Participation Level: Active  Behavioral Response: CasualAlertEuthymic  Type of Therapy: Individual Therapy  Treatment Goals addressed: Learn and implement problem solving and resolution skills to address interpersonal and personal problems evidenced by self report 1x per therapy session   ProgressTowards Goals: Progressing  Interventions: CBT  Summary:  Randall [name] is a 48 y.o. male who presents for the scheduled appointment oriented x 5, appropriately dressed, and friendly.  Client denied hallucinations and delusions. Client reported on today he has been doing well.  Client reported the relationship with his girlfriend is stable at the moment.  Client reported he told her it is best that they both have their own place to stay.  Client reported she wants him over her house all the time but he told her that he can get tired and sometimes that she can come spend time at his place as well.  Client reported he is the type of man and was raised to be the current moment that does not ask for anything from a woman.  Client reported he just enjoys having genuine relationships with people.  Client reported he is excited for his son to graduate from high school next month.  Client reported one of the day things from discharge she was also his mentor will be going to the graduation with him.  Client reported overall he has been maintaining well. Evidence of progress towards goal: Client reported 1 positive medication compliant 7 days a week.  Suicidal/Homicidal: Nowithout intent/plan  Therapist Response:  Therapist began the appointment asking client how he has been doing since last seen. Therapist engaged with active listening and positive emotional support. Therapist used CBT to ask client open-ended questions about how he is maintaining his wellness and regulating his emotions in day-to-day life. Therapist used CBT to continue taking the  client about boundaries and maintaining a self-care regimen. Therapist used CBT ask the client to identify his progress with frequency of use with coping skills with continued practice in his daily activity.    Therapist assigned client homework to practice self-care.  Plan: Return again in 4 weeks.  Diagnosis: mdd, in remission   Collaboration of Care: Patient refused AEB none requested by the client.  Patient/Guardian was advised Release of Information must be obtained prior to any record release in order to collaborate their care with an outside provider. Patient/Guardian was advised if they have not already done so to contact the registration department to sign all necessary forms in order for us  to release information regarding their care.   Consent: Patient/Guardian gives verbal consent for treatment and assignment of benefits for services provided during this visit. Patient/Guardian expressed understanding and agreed to proceed.   Keilyn Nadal Y Rahkeem Senft, LCSW 05/07/2023

## 2023-05-15 ENCOUNTER — Encounter (HOSPITAL_COMMUNITY): Payer: Self-pay | Admitting: Student

## 2023-05-15 ENCOUNTER — Ambulatory Visit (INDEPENDENT_AMBULATORY_CARE_PROVIDER_SITE_OTHER): Admitting: Student

## 2023-05-15 VITALS — Wt 209.0 lb

## 2023-05-15 DIAGNOSIS — F3340 Major depressive disorder, recurrent, in remission, unspecified: Secondary | ICD-10-CM | POA: Insufficient documentation

## 2023-05-15 DIAGNOSIS — R454 Irritability and anger: Secondary | ICD-10-CM | POA: Diagnosis not present

## 2023-05-15 DIAGNOSIS — Z79899 Other long term (current) drug therapy: Secondary | ICD-10-CM | POA: Diagnosis not present

## 2023-05-15 NOTE — Progress Notes (Signed)
 BH MD Outpatient Progress Note  05/15/2023 10:55 AM [name]  MRN:  [id number]  Assessment:  Randall [name] presents for follow-up evaluation.  The patient reports doing well with increased dose of Depakote .  He feels his anger issues have reduced in intensity.  Identifying Information: Randall [name] is a 48 y.o. y.o. male with a history of schizoaffective disorder bipolar type who is an established patient with [facility] for management of the aforementioned diagnosis.  The patient's previous diagnosis of schizoaffective disorder was removed based on prolonged stability off of any kind of dopamine blocking medication.  See below for current diagnosis.  Plan:  # MDD in remission, cannabis and alcohol abuse in remission  difficulty controlling anger, agitation Interventions: -- Continue Depakote  ER 1000 mg nightly - VPA level of 78 in mid April 2025, CBC and CMP unremarkable - Continue therapy with Paige  Patient was given contact information for behavioral health clinic and was instructed to call 911 for emergencies.   Subjective:  Chief Complaint:  Chief Complaint  Patient presents with   Follow-up    Interval History:  The patient reports some is graduating from high school soon and going to college.  The patient is quite happy about this.  He has told his son to not make the same mistakes he made.  Overall, the patient feels he has been doing well.  He reports a predominantly euthymic mood as well as regular and restful sleep.  He denies experiencing any suicidal thoughts.  He feels that his anger issues with his spouse have reduced in intensity recently.  Visit Diagnosis:    ICD-10-CM   1. Difficulty controlling anger  R45.4     2. MDD (recurrent major depressive disorder) in remission [facility])  F33.40        Past Psychiatric History:  The patient denies any history of self-harm behavior, including suicide attempts.  He reports 1  psychiatric admission occurring in July 28, 2001, after the death of his mother.  Patient has previously tried risperidone , but may have been experiencing akathisia.  Past Medical History:  Past Medical History:  Diagnosis Date   Bipolar 1 disorder (HCC)    under control   History of kidney stones    Sinus drainage    seasonal-spring    Past Surgical History:  Procedure Laterality Date   CIRCUMCISION  age 77   MULTIPLE EXTRACTIONS WITH ALVEOLOPLASTY N/A 12/13/2018   Procedure: MULTIPLE EXTRACTION WITH ALVEOLOPLASTY;  Surgeon: Ascencion Lava, DDS;  Location: [facility] OR;  Service: Oral Surgery;  Laterality: N/A;   NEPHROLITHOTOMY Right 12/23/2013   Procedure: RIGHT PERCUTANEOUS NEPHROLITHOTOMY WITH SURGEON ACCESS;  Surgeon: Andrez Banker, MD;  Location: [facility] ORS;  Service: Urology;  Laterality: Right;    Family Psychiatric History: None pertinent  Family History:  Family History  Problem Relation Age of Onset   Lung cancer Mother    Heart attack Father     Social History:  Social History   Socioeconomic History   Marital status: Single    Spouse name: Not on file   Number of children: Not on file   Years of education: Not on file   Highest education level: Not on file  Occupational History   Not on file  Tobacco Use   Smoking status: Every Day    Current packs/day: 1.00    Types: Cigarettes   Smokeless tobacco: Never  Vaping Use   Vaping status: Never Used  Substance and Sexual Activity  Alcohol use: Not Currently    Comment: occ   Drug use: No   Sexual activity: Not on file  Other Topics Concern   Not on file  Social History Narrative   Not on file   Social Drivers of Health   Financial Resource Strain: Medium Risk (03/08/2023)   Overall Financial Resource Strain (CARDIA)    Difficulty of Paying Living Expenses: Somewhat hard  Food Insecurity: Food Insecurity Present (03/23/2023)   Hunger Vital Sign    Worried About Running Out of Food in the Last Year: Often true     Ran Out of Food in the Last Year: Never true  Transportation Needs: No Transportation Needs (03/23/2023)   PRAPARE - Administrator, Civil Service (Medical): No    Lack of Transportation (Non-Medical): No  Physical Activity: Inactive (03/08/2023)   Exercise Vital Sign    Days of Exercise per Week: 0 days    Minutes of Exercise per Session: 0 min  Stress: No Stress Concern Present (03/08/2023)   [employer] of Occupational Health - Occupational Stress Questionnaire    Feeling of Stress : Not at all  Social Connections: Moderately Integrated (03/08/2023)   Social Connection and Isolation Panel [NHANES]    Frequency of Communication with Friends and Family: More than three times a week    Frequency of Social Gatherings with Friends and Family: More than three times a week    Attends Religious Services: More than 4 times per year    Active Member of [employer] or Organizations: Yes    Attends Engineer, structural: More than 4 times per year    Marital Status: Never married    Allergies: No Known Allergies  Current Medications: Current Outpatient Medications  Medication Sig Dispense Refill   divalproex  (DEPAKOTE  ER) 500 MG 24 hr tablet Take 2 tablets (1,000 mg total) by mouth at bedtime. 60 tablet 2   fluticasone  (FLONASE ) 50 MCG/ACT nasal spray Place 1-2 sprays into both nostrils daily as needed for up to 7 days for allergies. 16 g 0   loperamide  (IMODIUM ) 2 MG capsule Take 1 capsule (2 mg total) by mouth 4 (four) times daily as needed for diarrhea or loose stools. 12 capsule 0   omega-3 acid ethyl esters (LOVAZA ) 1 g capsule Take 1 capsule (1 g total) by mouth 2 (two) times daily. 60 capsule 2   simvastatin  (ZOCOR ) 10 MG tablet Take 1 tablet (10 mg total) by mouth every evening. 30 tablet 1   No current facility-administered medications for this visit.     Objective:  Psychiatric Specialty Exam: Physical Exam Constitutional:      Appearance: the patient is not  toxic-appearing.  Pulmonary:     Effort: Pulmonary effort is normal.  Neurological:     General: No focal deficit present.     Mental Status: the patient is alert and oriented to person, place, and time.   Review of Systems  Respiratory:  Negative for shortness of breath.   Cardiovascular:  Negative for chest pain.  Gastrointestinal:  Negative for abdominal pain, constipation, diarrhea, nausea and vomiting.  Neurological:  Negative for headaches.      Wt 209 lb (94.8 kg)   BMI 27.57 kg/m   General Appearance: Fairly Groomed  Eye Contact:  Good  Speech:  Clear and Coherent  Volume:  Normal  Mood: "Okay"  Affect:  Congruent, appropriate  Thought Process:  Coherent  Orientation:  Full (Time, Place, and Person)  Thought Content:  Logical   Suicidal Thoughts:  No  Homicidal Thoughts:  No  Memory:  Immediate;   Good  Judgement:  fair  Insight:  fair  Psychomotor Activity:  Normal  Concentration:  Concentration: Good  Recall:  Good  Fund of Knowledge: Good  Language: Good  Akathisia:  No  Handed:    AIMS (if indicated): not done  Assets:  Communication Skills Desire for Improvement Financial Resources/Insurance Housing Leisure Time Physical Health  ADL's:  Intact  Cognition: WNL  Sleep:  Fair     Metabolic Disorder Labs: Lab Results  Component Value Date   HGBA1C 6.0 (H) 06/07/2022   No results found for: "PROLACTIN" Lab Results  Component Value Date   CHOL 185 06/07/2022   TRIG 177 (H) 06/07/2022   HDL 30 (L) 06/07/2022   CHOLHDL 6.2 (H) 06/07/2022   LDLCALC 123 (H) 06/07/2022   LDLCALC 170 (H) 12/07/2021   Lab Results  Component Value Date   TSH 0.943 12/07/2021    Therapeutic Level Labs: No results found for: "LITHIUM" Lab Results  Component Value Date   VALPROATE 78 04/09/2023   VALPROATE 40 (L) 11/22/2022   No results found for: "CBMZ"  Screenings: AIMS    Flowsheet Row Clinical Support from 10/24/2021 in [facility]  AIMS Total Score 0      GAD-7    Flowsheet Row Counselor from 02/19/2023 in [facility] Counselor from 06/26/2022 in [facility] Clinical Support from 10/24/2021 in [facility] Video Visit from 02/22/2021 in [facility] Office Visit from 11/01/2020 in [facility]  Total GAD-7 Score 1 0 7 8 11       PHQ2-9    Flowsheet Row Clinical Support from 03/08/2023 in [facility] Counselor from 02/19/2023 in [facility] Counselor from 09/05/2022 in [facility] Clinical Support from 07/10/2022 in [facility] Counselor from 06/26/2022 in [facility]  PHQ-2 Total Score 1 1 1 2 1   PHQ-9 Total Score 2 1 2 5 1       Flowsheet Row Counselor from 02/19/2023 in [facility] from 06/02/2022 in [facility] from 01/03/2022 in [facility]  C-SSRS RISK CATEGORY No Risk No Risk No Risk       Collaboration of Care: none  A total of 30 minutes was spent involved in face to face clinical care, chart review, documentation.   Marilou Showman, MD 05/15/2023, 10:55 AM

## 2023-05-28 ENCOUNTER — Ambulatory Visit (INDEPENDENT_AMBULATORY_CARE_PROVIDER_SITE_OTHER): Admitting: Clinical

## 2023-05-28 DIAGNOSIS — F3340 Major depressive disorder, recurrent, in remission, unspecified: Secondary | ICD-10-CM

## 2023-05-28 NOTE — Progress Notes (Signed)
   THERAPIST PROGRESS NOTE  Session Time: 30 minutes  Participation Level: Active  Behavioral Response: CasualAlertEuthymic  Type of Therapy: Individual Therapy  Treatment Goals addressed:  Learn and implement problem solving and resolution skills to address interpersonal and personal problems evidenced by self report 1x per therapy session   ProgressTowards Goals: Progressing  Interventions: CBT and Supportive  Summary:  Randall [name] is a 48 y.o. male who presents for the scheduled appointment oriented times five, appropriately dressed and friendly. Client denied hallucinations and delusions. Client reported on today he is doing pretty okay. Client reported he has stopped helping a friend who provided service for helping people to move. Client reported he was not treating him respectfully and it probably ended their friendship. Client reported otherwise things are going well in his relationship. Client reported he will be going to his sons graduation soon. Client reported he is happy his son decided to move to [place] instead of [place] for school. Client reported he has no other complaints at this time. Evidence of progress towards goal:  client reported medication compliance 7 days per week. Client reported 1 positive of being able to appropriately handle interpersonal conflict with communication.  Suicidal/Homicidal: Nowithout intent/plan  Therapist Response:  Therapist began the appointment asking the client how he has been doing. Therapist engaged with active listening and positive emotional support. Therapist used cbt to engage and give him time to discuss his thoughts and feelings about recent events. Therapist used cbt to positively reinforce the client engagement with positive supports and self care. Therapist used CBT ask the client to identify her progress with frequency of use with coping skills with continued practice in her daily activity.    Therapist assigned the  client homework to practice self care.   Plan: Return again in 4 weeks.  Diagnosis: mdd, in remission  Collaboration of Care: Patient refused AEB none requested by the client.  Patient/Guardian was advised Release of Information must be obtained prior to any record release in order to collaborate their care with an outside provider. Patient/Guardian was advised if they have not already done so to contact the registration department to sign all necessary forms in order for us  to release information regarding their care.   Consent: Patient/Guardian gives verbal consent for treatment and assignment of benefits for services provided during this visit. Patient/Guardian expressed understanding and agreed to proceed.   Jess Toney Y Averianna Brugger, LCSW 05/28/2023

## 2023-06-07 ENCOUNTER — Ambulatory Visit (INDEPENDENT_AMBULATORY_CARE_PROVIDER_SITE_OTHER): Payer: Self-pay | Admitting: Nurse Practitioner

## 2023-06-07 ENCOUNTER — Encounter: Payer: Self-pay | Admitting: Nurse Practitioner

## 2023-06-07 VITALS — BP 124/88 | HR 76 | Temp 98.3°F | Wt 213.0 lb

## 2023-06-07 DIAGNOSIS — Z0000 Encounter for general adult medical examination without abnormal findings: Secondary | ICD-10-CM

## 2023-06-07 DIAGNOSIS — E782 Mixed hyperlipidemia: Secondary | ICD-10-CM | POA: Diagnosis not present

## 2023-06-07 DIAGNOSIS — Z13220 Encounter for screening for lipoid disorders: Secondary | ICD-10-CM

## 2023-06-07 DIAGNOSIS — Z1211 Encounter for screening for malignant neoplasm of colon: Secondary | ICD-10-CM

## 2023-06-07 DIAGNOSIS — Z13228 Encounter for screening for other metabolic disorders: Secondary | ICD-10-CM | POA: Diagnosis not present

## 2023-06-07 DIAGNOSIS — Z113 Encounter for screening for infections with a predominantly sexual mode of transmission: Secondary | ICD-10-CM

## 2023-06-07 LAB — POCT GLYCOSYLATED HEMOGLOBIN (HGB A1C): Hemoglobin A1C: 5.8 % — AB (ref 4.0–5.6)

## 2023-06-07 MED ORDER — SIMVASTATIN 10 MG PO TABS: 10.0000 mg | ORAL_TABLET | Freq: Every evening | ORAL | 1 refills | Status: DC

## 2023-06-07 MED ORDER — OMEGA-3-ACID ETHYL ESTERS 1 G PO CAPS: 1.0000 g | ORAL_CAPSULE | Freq: Two times a day (BID) | ORAL | 2 refills | Status: DC

## 2023-06-07 NOTE — Progress Notes (Signed)
 Subjective   Patient ID: Randall [name], male    DOB: 09-Oct-1975, 48 y.o.   MRN: [id number]  Chief Complaint  Patient presents with   Medical Management of Chronic Issues   Annual Exam    Referring provider: Jerrlyn Morel, NP  Randall [name] is a 48 y.o. male with Past Medical History: No date: Bipolar 1 disorder (HCC)     Comment:  under control No date: History of kidney stones No date: Sinus drainage     Comment:  seasonal-spring   HPI  Patient presents today for follow up.  Patient does have a history of mental illness and does see [facility] behavioral health for this.  Overall patient has been doing well.   We will order Cologuard for him for colon cancer screening.  He does need refill on Flonase  today.  Overall no new issues or concerns today.  He does need lipid recheck today.  Denies f/c/s, n/v/d, hemoptysis, PND, leg swelling. Denies chest pain or edema.    No Known Allergies  Immunization History  Administered Date(s) Administered   DTaP 01/24/2023   Influenza-Unspecified 11/18/2021, 01/23/2023   Pfizer Fall 2024 Covid-19 Vaccine 6mos thru 70yrs 11/18/2021    Tobacco History: Social History   Tobacco Use  Smoking Status Every Day   Current packs/day: 1.00   Types: Cigarettes  Smokeless Tobacco Never   Ready to quit: No Counseling given: Yes   Outpatient Encounter Medications as of 06/07/2023  Medication Sig   divalproex  (DEPAKOTE  ER) 500 MG 24 hr tablet Take 2 tablets (1,000 mg total) by mouth at bedtime.   [DISCONTINUED] omega-3 acid ethyl esters (LOVAZA ) 1 g capsule Take 1 capsule (1 g total) by mouth 2 (two) times daily.   [DISCONTINUED] simvastatin  (ZOCOR ) 10 MG tablet Take 1 tablet (10 mg total) by mouth every evening.   fluticasone  (FLONASE ) 50 MCG/ACT nasal spray Place 1-2 sprays into both nostrils daily as needed for up to 7 days for allergies.   loperamide  (IMODIUM ) 2 MG capsule Take 1 capsule (2 mg total) by mouth 4 (four)  times daily as needed for diarrhea or loose stools.   omega-3 acid ethyl esters (LOVAZA ) 1 g capsule Take 1 capsule (1 g total) by mouth 2 (two) times daily.   simvastatin  (ZOCOR ) 10 MG tablet Take 1 tablet (10 mg total) by mouth every evening.   No facility-administered encounter medications on file as of 06/07/2023.    Review of Systems  Review of Systems  Constitutional: Negative.   HENT: Negative.    Cardiovascular: Negative.   Gastrointestinal: Negative.   Allergic/Immunologic: Negative.   Neurological: Negative.   Psychiatric/Behavioral: Negative.       Objective:   BP 124/88   Pulse 76   Temp 98.3 F (36.8 C) (Oral)   Wt 213 lb (96.6 kg)   SpO2 98%   BMI 28.10 kg/m   Wt Readings from Last 5 Encounters:  06/07/23 213 lb (96.6 kg)  03/08/23 209 lb (94.8 kg)  06/07/22 222 lb 9.6 oz (101 kg)  03/02/22 218 lb (98.9 kg)  12/07/21 218 lb (98.9 kg)     Physical Exam Vitals and nursing note reviewed.  Constitutional:      General: He is not in acute distress.    Appearance: He is well-developed.  Cardiovascular:     Rate and Rhythm: Normal rate and regular rhythm.  Pulmonary:     Effort: Pulmonary effort is normal.     Breath sounds: Normal  breath sounds.  Skin:    General: Skin is warm and dry.  Neurological:     Mental Status: He is alert and oriented to person, place, and time.       Assessment & Plan:   Colon cancer screening  Lipid screening -     Lipid panel  Routine adult health maintenance -     CBC -     Comprehensive metabolic panel with GFR  Mixed hyperlipidemia -     Simvastatin ; Take 1 tablet (10 mg total) by mouth every evening.  Dispense: 30 tablet; Refill: 1  Screen for STD (sexually transmitted disease) -     RPR+HIV+GC+CT Panel -     Chlamydia/Gonococcus/Trichomonas, NAA  Other orders -     Omega-3-acid  Ethyl Esters; Take 1 capsule (1 g total) by mouth 2 (two) times daily.  Dispense: 60 capsule; Refill: 2     Return in  about 6 months (around 12/08/2023).     Jerrlyn Morel, NP 06/07/2023

## 2023-06-07 NOTE — Patient Instructions (Signed)
 1. Colon cancer screening (Primary)   2. Lipid screening  - Lipid Panel  3. Routine adult health maintenance  - CBC - Comprehensive metabolic panel with GFR  4. Mixed hyperlipidemia  - simvastatin  (ZOCOR ) 10 MG tablet; Take 1 tablet (10 mg total) by mouth every evening.  Dispense: 30 tablet; Refill: 1  5. Screen for STD (sexually transmitted disease)  - RPR+HIV+GC+CT Panel - Chlamydia/Gonococcus/Trichomonas, NAA

## 2023-06-08 ENCOUNTER — Ambulatory Visit: Payer: Self-pay | Admitting: Nurse Practitioner

## 2023-06-08 ENCOUNTER — Telehealth: Payer: Self-pay

## 2023-06-08 LAB — COMPREHENSIVE METABOLIC PANEL WITH GFR
ALT: 23 IU/L (ref 0–44)
AST: 23 IU/L (ref 0–40)
Albumin: 4.5 g/dL (ref 4.1–5.1)
Alkaline Phosphatase: 75 IU/L (ref 44–121)
BUN/Creatinine Ratio: 5 — ABNORMAL LOW (ref 9–20)
BUN: 6 mg/dL (ref 6–24)
Bilirubin Total: 0.3 mg/dL (ref 0.0–1.2)
CO2: 22 mmol/L (ref 20–29)
Calcium: 9.6 mg/dL (ref 8.7–10.2)
Chloride: 99 mmol/L (ref 96–106)
Creatinine, Ser: 1.17 mg/dL (ref 0.76–1.27)
Globulin, Total: 3.2 g/dL (ref 1.5–4.5)
Glucose: 87 mg/dL (ref 70–99)
Potassium: 4.7 mmol/L (ref 3.5–5.2)
Sodium: 137 mmol/L (ref 134–144)
Total Protein: 7.7 g/dL (ref 6.0–8.5)
eGFR: 78 mL/min/{1.73_m2} (ref >59)

## 2023-06-08 LAB — CBC
Hematocrit: 47.1 % (ref 37.5–51.0)
Hemoglobin: 15.6 g/dL (ref 13.0–17.7)
MCH: 29.9 pg (ref 26.6–33.0)
MCHC: 33.1 g/dL (ref 31.5–35.7)
MCV: 90 fL (ref 79–97)
Platelets: 213 10*3/uL (ref 150–450)
RBC: 5.21 x10E6/uL (ref 4.14–5.80)
RDW: 12.9 % (ref 11.6–15.4)
WBC: 7.2 10*3/uL (ref 3.4–10.8)

## 2023-06-08 LAB — LIPID PANEL
Chol/HDL Ratio: 7.1 ratio — ABNORMAL HIGH (ref 0.0–5.0)
Cholesterol, Total: 250 mg/dL — ABNORMAL HIGH (ref 100–199)
HDL: 35 mg/dL — ABNORMAL LOW (ref >39)
LDL Chol Calc (NIH): 194 mg/dL — ABNORMAL HIGH (ref 0–99)
Triglycerides: 113 mg/dL (ref 0–149)
VLDL Cholesterol Cal: 21 mg/dL (ref 5–40)

## 2023-06-08 NOTE — Telephone Encounter (Signed)
 Copied from CRM [id number]. Topic: Clinical - Lab/Test Results >> Jun 08, 2023  3:29 PM Zipporah Him wrote: Reason for CRM: Patient is calling in to get results of HIV test, chart states not released. Patient requests a call back as soon as they are available.

## 2023-06-10 LAB — RPR+HIV+GC+CT PANEL
Chlamydia trachomatis, NAA: NEGATIVE
HIV Screen 4th Generation wRfx: NONREACTIVE
Neisseria Gonorrhoeae by PCR: NEGATIVE
RPR Ser Ql: NONREACTIVE

## 2023-06-10 LAB — CHLAMYDIA/GONOCOCCUS/TRICHOMONAS, NAA
Chlamydia by NAA: NEGATIVE
Gonococcus by NAA: NEGATIVE
Trich vag by NAA: NEGATIVE

## 2023-06-12 NOTE — Telephone Encounter (Signed)
 Pt was advise. Randall [name]

## 2023-06-13 ENCOUNTER — Ambulatory Visit (HOSPITAL_COMMUNITY): Admitting: Clinical

## 2023-06-13 ENCOUNTER — Encounter (HOSPITAL_COMMUNITY): Payer: Self-pay

## 2023-07-02 ENCOUNTER — Telehealth (HOSPITAL_COMMUNITY): Payer: Self-pay

## 2023-07-02 ENCOUNTER — Telehealth (HOSPITAL_COMMUNITY): Payer: Self-pay | Admitting: Student

## 2023-07-02 ENCOUNTER — Ambulatory Visit (INDEPENDENT_AMBULATORY_CARE_PROVIDER_SITE_OTHER): Admitting: Clinical

## 2023-07-02 DIAGNOSIS — F3340 Major depressive disorder, recurrent, in remission, unspecified: Secondary | ICD-10-CM | POA: Diagnosis not present

## 2023-07-02 NOTE — Telephone Encounter (Signed)
 Called the patient at the number listed in the chart to discuss elevated cholesterol level found by primary care physician.  The patient has followed regularly with me in the clinic and has been on Depakote  for approximately 1 year.  The dose was increased from 500 mg to 1000 mg a few months ago.  Per CMA documentation he is concerned about elevated cholesterol level.  LDL 190.  It does not appear that Depakote  has caused significant weight gain for the patient.  It also seems that the patient may not be adherent to his cholesterol medication.  Nonetheless, the medication could have some contribution to his elevated cholesterol.  The medication is currently prescribed for difficulty controlling anger and irritability, so would certainly be reasonable for the patient to take a lower dose.  Would be good to reinvestigate history of manic episodes, though suspicion for schizoaffective disorder is quite low as documented previously.

## 2023-07-02 NOTE — Telephone Encounter (Signed)
 Sending this over to provider. Pt was last seen today in clinic with therapist. Please let us  know how to further assist.    JNL

## 2023-07-03 ENCOUNTER — Other Ambulatory Visit: Payer: Self-pay | Admitting: Nurse Practitioner

## 2023-07-03 ENCOUNTER — Telehealth (HOSPITAL_COMMUNITY): Payer: Self-pay | Admitting: Student

## 2023-07-03 DIAGNOSIS — R454 Irritability and anger: Secondary | ICD-10-CM

## 2023-07-03 DIAGNOSIS — E782 Mixed hyperlipidemia: Secondary | ICD-10-CM

## 2023-07-03 MED ORDER — SIMVASTATIN 10 MG PO TABS
10.0000 mg | ORAL_TABLET | Freq: Every evening | ORAL | 1 refills | Status: DC
Start: 2023-07-03 — End: 2023-09-20

## 2023-07-03 MED ORDER — HYDROXYZINE HCL 25 MG PO TABS: 25.0000 mg | ORAL_TABLET | Freq: Two times a day (BID) | ORAL | 1 refills | Status: DC | PRN
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
## 2023-07-03 NOTE — Telephone Encounter (Signed)
 Copied from CRM [id number]. Topic: Clinical - Medication Refill >> Jul 03, 2023 11:05 AM Donald Frost wrote: Medication: simvastatin  (ZOCOR ) 10 MG tablet  Has the patient contacted their pharmacy? Yes   This is the patient's preferred pharmacy:  [id number] - [place] - [address] [place] ([place]) [place] [address] Phone: [phone] Fax: [phone]    Is this the correct pharmacy for this prescription? Yes If no, delete pharmacy and type the correct one.   Has the prescription been filled recently? Yes  Is the patient out of the medication? No  Has the patient been seen for an appointment in the last year OR does the patient have an upcoming appointment? Yes  Can we respond through MyChart? Yes  Please assist patient further

## 2023-07-03 NOTE — Telephone Encounter (Signed)
 Was able to make contact patient.  He does report a strong desire to decrease his dose of Depakote  to see if it will help his metabolic parameters.  This is certainly reasonable. I reviewed previous manic symptomatology and it does not appear the patient has had manic episodes previously.  The patient feels that he can manage his anger and irritability with the reduced dose of the medication.  He appears to have been functioning fairly well even when he was on the reduced dose of the medication.  He did ask for as needed medication that he can take occasionally to help with sudden anger and anxiety.  Discussed the use of hydroxyzine  on an as-needed basis.  He will see Dr. Sherrell Dodrill on 7/1.

## 2023-07-18 NOTE — Progress Notes (Unsigned)
 [facility] MD Outpatient Progress Note  07/24/2023 8:09 AM Randall [name]  MRN:  [id number]  Assessment:  Wolm Rayne Fireman presents for follow-up evaluation.  The patient reports doing well with decreased dose of Depakote .  He feels his anger issues have remained around the same. He reports no issues with his mood, sleep, or appetite. He reports compliance with medications. He had slightly elevated BP reading on exam today (139/99), discussed with patient to talk with his PCP about this. Also discussed tobacco cessation and gave counseling. He is in pre-contemplative stage but was open to getting prescription for nicotine  replacement therapy. Provided refills for hydroxyzine  and depakote .   Identifying Information: Randall [name] is a 48 y.o. y.o. male with a history of schizoaffective disorder bipolar type who is an established patient with [facility] for management of the aforementioned diagnosis.  The patient's previous diagnosis of schizoaffective disorder was removed based on prolonged stability off of any kind of dopamine blocking medication.  See below for current diagnosis.  Plan:  # MDD in remission, cannabis and alcohol abuse in remission  difficulty controlling anger, agitation Interventions: -- Continue decreased Depakote  ER 500 mg nightly -- Continue hydroxyzine  25mg  BID for anxiety - VPA level of 78 in mid April 2025, CBC and CMP unremarkable - Continue therapy with Paige  #Tobacco use disorder -provided cessation counseling -start nicotine  patch 21mg  daily   Patient was given contact information for behavioral health clinic and was instructed to call 911 for emergencies.   Subjective:  Chief Complaint:  It's nice to meet you   Interval History:  PDMP no Rx  -decreased dose of depakote  due to elevated cholesterol level (LDL 194, cholesterol 250) Doing well, taking medicines daily No issues with sleep No issues with appetite, feel like the  medications increase his appetite. Reports that he drinks Red Bull, will drink one in the morning but he is thinking about cutting down on this  Feel like anxiety increases when someone makes him mad (spousal issues) but feel like is able to walk away, still feel like is able to deal with increased anxiety with decreased depakote . Spousal issues mainly stem from patient feeling like the spouse is controlling him and he doesn't like that.  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  Reports he is taking the hydroxyzine  2 times a day, taking it in the morning and afternoon, feel like it has helped with anxiety  Denies thoughts of hurting self or anyone else Been clean from alcohol or weed since 08-25-2011 Does smoke cigarettes, is in pre-contemplative stage, states a couple of months, is open to getting patches and gum. Smoking 1ppd since 48 yo, feels like smoking keeps him calm   PHQ-9: 1 (feeling bad about self) GAD-7: 1 (trouble relaxing)  Visit Diagnosis:    ICD-10-CM   1. MDD (recurrent major depressive disorder) in remission (HCC)  F33.40     2. Difficulty controlling anger  R45.4 divalproex  (DEPAKOTE  ER) 500 MG 24 hr tablet    hydrOXYzine  (ATARAX ) 25 MG tablet    3. Tobacco use disorder  F17.200       Past Psychiatric History:  The patient denies any history of self-harm behavior, including suicide attempts.  He reports 1 psychiatric admission occurring in 08-24-2001, after the death of his mother.  Patient has previously tried risperidone , but may  have been experiencing akathisia.  Past Medical History:  Past Medical History:  Diagnosis Date   Bipolar 1 disorder (HCC)    under control   History of kidney stones    Sinus drainage    seasonal-spring    Past Surgical History:  Procedure Laterality Date   CIRCUMCISION  age 65   MULTIPLE  EXTRACTIONS WITH ALVEOLOPLASTY N/A 12/13/2018   Procedure: MULTIPLE EXTRACTION WITH ALVEOLOPLASTY;  Surgeon: Sheryle Hamilton, DDS;  Location: [facility] OR;  Service: Oral Surgery;  Laterality: N/A;   NEPHROLITHOTOMY Right 12/23/2013   Procedure: RIGHT PERCUTANEOUS NEPHROLITHOTOMY WITH SURGEON ACCESS;  Surgeon: Morene LELON Salines, MD;  Location: [facility] ORS;  Service: Urology;  Laterality: Right;    Family Psychiatric History: None pertinent  Family History:  Family History  Problem Relation Age of Onset   Lung cancer Mother    Heart attack Father     Social History:  Industrial/product designer for a living  Originally from [place], moved to [place] in 2013 Has been dating for 5 years, engaged for 1 year   Social History   Socioeconomic History   Marital status: Single    Spouse name: Not on file   Number of children: Not on file   Years of education: Not on file   Highest education level: Some college, no degree  Occupational History   Not on file  Tobacco Use   Smoking status: Every Day    Current packs/day: 1.00    Types: Cigarettes   Smokeless tobacco: Never  Vaping Use   Vaping status: Never Used  Substance and Sexual Activity   Alcohol use: Not Currently    Comment: occ   Drug use: No   Sexual activity: Not on file  Other Topics Concern   Not on file  Social History Narrative   Not on file   Social Drivers of Health   Financial Resource Strain: Medium Risk (06/02/2023)   Overall Financial Resource Strain (CARDIA)    Difficulty of Paying Living Expenses: Somewhat hard  Food Insecurity: Food Insecurity Present (06/02/2023)   Hunger Vital Sign    Worried About Running Out of Food in the Last Year: Often true    Ran Out of Food in the Last Year: Often true  Transportation Needs: No Transportation Needs (06/02/2023)   PRAPARE - Administrator, Civil Service (Medical): No    Lack of Transportation (Non-Medical): No  Physical Activity: Insufficiently Active (06/02/2023)    Exercise Vital Sign    Days of Exercise per Week: 3 days    Minutes of Exercise per Session: 30 min  Stress: No Stress Concern Present (06/02/2023)   [employer] of Occupational Health - Occupational Stress Questionnaire    Feeling of Stress : Only a little  Social Connections: Moderately Integrated (06/02/2023)   Social Connection and Isolation Panel    Frequency of Communication with Friends and Family: Three times a week    Frequency of Social Gatherings with Friends and Family: Twice a week    Attends Religious Services: More than 4 times per year    Active Member of [employer] or Organizations: Yes    Attends Banker Meetings: 1 to 4 times per year    Marital Status: Never married    Allergies: No Known Allergies  Current Medications: Current Outpatient Medications  Medication Sig Dispense Refill   nicotine  (NICODERM CQ  - DOSED IN MG/24 HOURS) 21 mg/24hr patch Place 1 patch (21 mg total) onto the skin daily. 28  patch 2   divalproex  (DEPAKOTE  ER) 500 MG 24 hr tablet Take 2 tablets (1,000 mg total) by mouth at bedtime. 60 tablet 2   fluticasone  (FLONASE ) 50 MCG/ACT nasal spray Place 1-2 sprays into both nostrils daily as needed for up to 7 days for allergies. 16 g 0   hydrOXYzine  (ATARAX ) 25 MG tablet Take 1 tablet (25 mg total) by mouth 2 (two) times daily as needed. 30 tablet 1   loperamide  (IMODIUM ) 2 MG capsule Take 1 capsule (2 mg total) by mouth 4 (four) times daily as needed for diarrhea or loose stools. 12 capsule 0   omega-3 acid ethyl esters (LOVAZA ) 1 g capsule Take 1 capsule (1 g total) by mouth 2 (two) times daily. 60 capsule 2   simvastatin  (ZOCOR ) 10 MG tablet Take 1 tablet (10 mg total) by mouth every evening. 30 tablet 1   No current facility-administered medications for this visit.   Objective:  Psychiatric Specialty Exam: Physical Exam Constitutional:      Appearance: the patient is not toxic-appearing.  Pulmonary:     Effort: Pulmonary effort  is normal.  Neurological:     General: No focal deficit present.     Mental Status: the patient is alert and oriented to person, place, and time.   Review of Systems  Respiratory:  Negative for shortness of breath.   Cardiovascular:  Negative for chest pain.  Gastrointestinal:  Negative for abdominal pain, constipation, diarrhea, nausea and vomiting.  Neurological:  Negative for headaches.      BP (!) 139/99   Pulse 63   Wt 213 lb 9.6 oz (96.9 kg)   SpO2 97%   BMI 28.18 kg/m   General Appearance: Fairly Groomed, cigarette smoke smell  Eye Contact:  Good  Speech:  Clear and Coherent  Volume:  Normal  Mood: Doing well  Affect:  Congruent, appropriate  Thought Process:  Coherent  Orientation:  Full (Time, Place, and Person)  Thought Content: Logical   Suicidal Thoughts:  No  Homicidal Thoughts:  No  Memory:  Immediate;   Good  Judgement:  fair  Insight:  fair  Psychomotor Activity:  Normal  Concentration:  Concentration: Good  Recall:  Good  Fund of Knowledge: Good  Language: Good  Akathisia:  No  Handed:    AIMS (if indicated): not done  Assets:  Communication Skills Desire for Improvement Financial Resources/Insurance Housing Leisure Time Physical Health  ADL's:  Intact  Cognition: WNL  Sleep:  Fair     Metabolic Disorder Labs: Lab Results  Component Value Date   HGBA1C 5.8 (A) 06/07/2023   No results found for: PROLACTIN Lab Results  Component Value Date   CHOL 250 (H) 06/07/2023   TRIG 113 06/07/2023   HDL 35 (L) 06/07/2023   CHOLHDL 7.1 (H) 06/07/2023   LDLCALC 194 (H) 06/07/2023   LDLCALC 123 (H) 06/07/2022   Lab Results  Component Value Date   TSH 0.943 12/07/2021    Therapeutic Level Labs: No results found for: LITHIUM Lab Results  Component Value Date   VALPROATE 78 04/09/2023   VALPROATE 40 (L) 11/22/2022   No results found for: CBMZ  Screenings: AIMS    Flowsheet Row Clinical Support from 10/24/2021 in [facility]  AIMS Total Score 0   GAD-7    Flowsheet Row Counselor from 02/19/2023 in [facility] Counselor from 06/26/2022 in [facility] Clinical Support from 10/24/2021 in [facility] Video Visit from 02/22/2021 in [facility] Office Visit from 11/01/2020 in [facility]  Total GAD-7 Score 1 0 7 8 11    PHQ2-9    Flowsheet Row Office Visit from 06/07/2023 in [facility] Clinical Support from 03/08/2023 in [facility] Counselor from 02/19/2023 in [facility] Counselor from 09/05/2022 in [facility] Clinical Support from 07/10/2022 in [facility]  PHQ-2 Total Score 0 1 1 1 2   PHQ-9 Total Score 0 2 1 2 5    Flowsheet Row Counselor from 02/19/2023 in [facility] UC from 06/02/2022 in [facility] from 01/03/2022 in [facility]  C-SSRS RISK CATEGORY No Risk No Risk No Risk    Collaboration of Care: none  A total of 30 minutes was spent involved in face to face clinical care, chart review, documentation.   Corean Minor, MD, PGY-2 07/24/2023, 8:09 AM

## 2023-07-24 ENCOUNTER — Ambulatory Visit (INDEPENDENT_AMBULATORY_CARE_PROVIDER_SITE_OTHER): Admitting: Psychiatry

## 2023-07-24 ENCOUNTER — Encounter (HOSPITAL_COMMUNITY): Payer: Self-pay | Admitting: Psychiatry

## 2023-07-24 VITALS — BP 139/99 | HR 63 | Wt 213.6 lb

## 2023-07-24 DIAGNOSIS — F17200 Nicotine dependence, unspecified, uncomplicated: Secondary | ICD-10-CM | POA: Insufficient documentation

## 2023-07-24 DIAGNOSIS — R454 Irritability and anger: Secondary | ICD-10-CM

## 2023-07-24 DIAGNOSIS — F3340 Major depressive disorder, recurrent, in remission, unspecified: Secondary | ICD-10-CM

## 2023-07-24 MED ORDER — HYDROXYZINE HCL 25 MG PO TABS: 25.0000 mg | ORAL_TABLET | Freq: Two times a day (BID) | ORAL | 2 refills | Status: DC | PRN

## 2023-07-24 MED ORDER — HYDROXYZINE HCL 25 MG PO TABS: 25.0000 mg | ORAL_TABLET | Freq: Two times a day (BID) | ORAL | 1 refills | Status: DC | PRN

## 2023-07-24 MED ORDER — DIVALPROEX SODIUM ER 500 MG PO TB24: 500.0000 mg | ORAL_TABLET | Freq: Every day | ORAL | 2 refills | Status: DC

## 2023-07-24 MED ORDER — DIVALPROEX SODIUM ER 500 MG PO TB24: 1000.0000 mg | ORAL_TABLET | Freq: Every day | ORAL | 2 refills | Status: DC

## 2023-07-24 MED ORDER — NICOTINE 21 MG/24HR TD PT24: 21.0000 mg | MEDICATED_PATCH | Freq: Every day | TRANSDERMAL | 2 refills | Status: AC

## 2023-07-24 NOTE — Addendum Note (Signed)
 Addended by: GRAHAM KRABBE on: 07/24/2023 08:16 AM   Modules accepted: Orders

## 2023-07-24 NOTE — Patient Instructions (Signed)
 Please continue to take your depakote  at the decreased dose  Please continue to take your hydroxyzine  BID  Please start taking nicotine  patch

## 2023-07-25 NOTE — Addendum Note (Signed)
 Addended by: MERCY DOMINO A on: 07/25/2023 08:45 AM   Modules accepted: Level of Service

## 2023-08-14 ENCOUNTER — Ambulatory Visit (INDEPENDENT_AMBULATORY_CARE_PROVIDER_SITE_OTHER): Admitting: Clinical

## 2023-08-14 DIAGNOSIS — F3340 Major depressive disorder, recurrent, in remission, unspecified: Secondary | ICD-10-CM

## 2023-08-14 NOTE — Progress Notes (Signed)
   THERAPIST PROGRESS NOTE  Session Time: 40 minutes  Participation Level: Active  Behavioral Response: CasualAlertIrritable  Type of Therapy: Individual Therapy  Treatment Goals addressed: Learn and implement problem solving and resolution skills to address interpersonal and personal problems evidenced by self report 1x per therapy session   ProgressTowards Goals: Progressing  Interventions: CBT and Supportive  Summary:  Randall [name] is a 48 y.o. male who presents for the scheduled appointment oriented times five, appropriately dressed and friendly. Client denied hallucinations and delusions. Client reported he is doing well. Client reported he is trying to get his son to come visit from [place] before he starts school. Client reported his son is 25 year old but his mother still has negative comments to make about their son going to see him. Client reported he does not understand why she still holding grudges. Client reported to keep the peace he told his son to talk to his mom about coming to see him before he sends him a train ticket. Client reported otherwise he is thinking he will break up with his girlfriend. Client reported he likes to spend time to himself but his girlfriend always accuses him of cheating on her and that is not happening. Evidence of progress towards goal:  client reported 1 positive which is managing the issues within family   Suicidal/Homicidal: Nowithout intent/plan  Therapist Response:  Therapist began the appointment asking the client how he has been doing. Therapist engaged with active listening and positive emotional support. Therapist used cbt to engage and ask him about stressors that have been going on with family and interpersonal relationships. Therapist used cbt to engage and identify positives of his ability to process and have boundaries in stressful situations.  Therapist used cbt to teach the client about continuing to practice good  boundaries and communication. Therapist used CBT ask the client to identify his progress with frequency of use with coping skills with continued practice in his daily activity.       Plan: Return again in 4 weeks.  Diagnosis: mdd,in remission  Collaboration of Care: Patient refused AEB none requested by the client.  Patient/Guardian was advised Release of Information must be obtained prior to any record release in order to collaborate their care with an outside provider. Patient/Guardian was advised if they have not already done so to contact the registration department to sign all necessary forms in order for us  to release information regarding their care.   Consent: Patient/Guardian gives verbal consent for treatment and assignment of benefits for services provided during this visit. Patient/Guardian expressed understanding and agreed to proceed.   Betsabe Iglesia Y Terence Googe, LCSW 08/14/2023

## 2023-08-29 ENCOUNTER — Ambulatory Visit (HOSPITAL_COMMUNITY): Admitting: Clinical

## 2023-09-07 NOTE — Progress Notes (Unsigned)
 [facility] MD Outpatient Progress Note  09/11/2023 9:20 AM Randall [name]  MRN:  [id number]  Assessment:  Randall [name] presents for follow-up evaluation.  The patient reports that he is doing well.  He feels no change with his anger issues, has some psychosocial stressors with his son and fiance. He reports no issues overall with his mood, sleep, or appetite. He reports compliance with medications. He had slightly elevated BP reading on exam again today, discussed with patient to talk with his PCP about this. He has upcoming appointment in November. Also again discussed tobacco cessation and gave counseling and information for QUITLINE. He is in pre-contemplative stage but was open to getting prescription for nicotine  replacement therapy. We also discussed his prior diagnosis of schizoaffective disorder bipolar type and provided psychoeducation that this is less likely diagnosis given his reported symptoms of hallucinations and manic episodes and prolonged stability off any dopamine blocking medication. He has a history of nuchal cord as an infant and history of being in special education classes and low IQ. Suspect that his symptoms are more consistent with increased impulsivity and irritability in the setting of developmental delay. Provided refills for hydroxyzine  and depakote .   Identifying Information: Randall [name] is a 48 y.o. y.o. male with a history of schizoaffective disorder bipolar type who is an established patient with [facility] for management of the aforementioned diagnosis.  The patient's previous diagnosis of schizoaffective disorder was removed based on prolonged stability off of any kind of dopamine blocking medication.  See below for current diagnosis.  Plan:  # MDD in remission, cannabis and alcohol abuse in remission  difficulty controlling anger, agitation Interventions: -- Continue decreased Depakote  ER 500 mg nightly -- Continue hydroxyzine  25mg   BID for anxiety - VPA level of 78 in mid April 2025 (level while on Depakote  1000 mg qHS), CBC and CMP unremarkable - Continue therapy with Paige  #Tobacco use disorder -provided cessation counseling -continue nicotine  patch 21mg  daily   Health maintenance:  Elevated BP - follow up with PCP   Patient was given contact information for behavioral health clinic and was instructed to call 911 for emergencies.   Future Appointments  Date Time Provider Department Center  09/17/2023  8:00 AM Cozart, Carlyon GRADE, [place] [facility] None  10/08/2023  8:00 AM Cozart, Carlyon GRADE, LCSW [facility] None  10/22/2023  8:00 AM Cozart, Carlyon GRADE, LCSW [facility] None  11/27/2023  9:00 AM Graham Krabbe, MD [facility] None  12/10/2023  9:20 AM Oley Bascom RAMAN, NP [facility] None  03/13/2024  8:00 AM [facility]NUAL WELLNESS VISIT [facility] None    Subjective:  Chief Complaint:  It's nice to meet you   Interval History:  PDMP no Rx  Saw therapist 7/22, cancelled appointment 8/6, sees her again on the 25th  No new labs   Reports that he drank a red bull before appointment, will see PCP in November. No issues with appetite.  Reports taking the depakote  500mg  at bedtime. Reports he is still taking the hydroxyzine  25mg  BID. Reports no issues with either of them. Reports he feels that the medicine helps him with irritability.  Reports diagnosed with schizoaffective disorder bipolar type around 2018. Reports feeling sad that his son's mom prevents him from coming to see him. Reports relationship with fiance is [place], has some stressors with fiance thinking that he is cheating on her. Reports that he sometimes hear things, reports can sometimes hear his mom's voice, reports that he will see things that  other people can't see such as being able to tell when other people have a bad attitude. Reports that he sometimes goes to sleep at 10pm and wakes up at 2am. On average he gets 8 hours of sleep. Denies increased talkativeness, reports  decreased sleep but then would be watching tv. Reports no cannabis and alcohol use, have been since 09-20-11. Didn't pick up the nicotine  patch. Patient denies current SI/HI/AVH.   PHQ-9: 1 (issues with sleep) GAD-7: 0   Visit Diagnosis:    ICD-10-CM   1. Difficulty controlling anger  R45.4 divalproex  (DEPAKOTE  ER) 500 MG 24 hr tablet    hydrOXYzine  (ATARAX ) 25 MG tablet       Past Psychiatric History:  The patient denies any history of self-harm behavior, including suicide attempts.  He reports 1 psychiatric admission occurring in September 19, 2001, after the death of his mother, reports at that time was diagnosed with MDD.  Patient has previously tried risperidone , but may have been experiencing akathisia.  Past Medical History:  Past Medical History:  Diagnosis Date   Bipolar 1 disorder (HCC)    under control   History of kidney stones    Sinus drainage    seasonal-spring    Past Surgical History:  Procedure Laterality Date   CIRCUMCISION  age 61   MULTIPLE EXTRACTIONS WITH ALVEOLOPLASTY N/A 12/13/2018   Procedure: MULTIPLE EXTRACTION WITH ALVEOLOPLASTY;  Surgeon: Sheryle Hamilton, DDS;  Location: [facility] OR;  Service: Oral Surgery;  Laterality: N/A;   NEPHROLITHOTOMY Right 12/23/2013   Procedure: RIGHT PERCUTANEOUS NEPHROLITHOTOMY WITH SURGEON ACCESS;  Surgeon: Morene LELON Salines, MD;  Location: [facility];  Service: Urology;  Laterality: Right;    Family Psychiatric History:  Dad had schizophrenia, used to boxing   Family History:  Family History  Problem Relation Age of Onset   Lung cancer Mother    Heart attack Father     Social History:  Industrial/product designer for a living  Originally from [place], moved to [place] in 20-Sep-2011 Has been dating for 5 years, engaged for 1 year Has a son, ex wife Had 7 siblings (6 boys, 1 girl), he's in the middle Son has autism Reports he was in special education classes in middle school Reports that 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, reports he had  low IQ  Reports he graduated high school, went to college for a little bit, did 2 years of college  Went to a trade school in [place] On disability for mental health since 20-Sep-2010   Social History   Socioeconomic History   Marital status: Single    Spouse name: Not on file   Number of children: Not on file   Years of education: Not on file   Highest education level: Some college, no degree  Occupational History   Not on file  Tobacco Use   Smoking status: Every Day    Current packs/day: 1.00    Types: Cigarettes   Smokeless tobacco: Never  Vaping Use   Vaping status: Never Used  Substance and Sexual Activity   Alcohol use: Not Currently    Comment: occ   Drug use: No   Sexual activity: Not on file  Other Topics Concern   Not on file  Social History Narrative   Not on file   Social Drivers of Health   Financial Resource Strain: Medium Risk (06/02/2023)   Overall Financial Resource Strain (CARDIA)    Difficulty of Paying Living Expenses: Somewhat hard  Food Insecurity:  Food Insecurity Present (06/02/2023)   Hunger Vital Sign    Worried About Running Out of Food in the Last Year: Often true    Ran Out of Food in the Last Year: Often true  Transportation Needs: No Transportation Needs (06/02/2023)   PRAPARE - Administrator, Civil Service (Medical): No    Lack of Transportation (Non-Medical): No  Physical Activity: Insufficiently Active (06/02/2023)   Exercise Vital Sign    Days of Exercise per Week: 3 days    Minutes of Exercise per Session: 30 min  Stress: No Stress Concern Present (06/02/2023)   [employer] of Occupational Health - Occupational Stress Questionnaire    Feeling of Stress : Only a little  Social Connections: Moderately Integrated (06/02/2023)   Social Connection and Isolation Panel    Frequency of Communication with Friends and Family: Three times a week    Frequency of Social Gatherings with Friends and Family: Twice a week    Attends Religious  Services: More than 4 times per year    Active Member of [employer] or Organizations: Yes    Attends Banker Meetings: 1 to 4 times per year    Marital Status: Never married    Allergies: No Known Allergies  Current Medications: Current Outpatient Medications  Medication Sig Dispense Refill   [START ON 09/24/2023] divalproex  (DEPAKOTE  ER) 500 MG 24 hr tablet Take 1 tablet (500 mg total) by mouth at bedtime. 30 tablet 2   fluticasone  (FLONASE ) 50 MCG/ACT nasal spray Place 1-2 sprays into both nostrils daily as needed for up to 7 days for allergies. 16 g 0   [START ON 09/24/2023] hydrOXYzine  (ATARAX ) 25 MG tablet Take 1 tablet (25 mg total) by mouth 2 (two) times daily as needed. 60 tablet 2   loperamide  (IMODIUM ) 2 MG capsule Take 1 capsule (2 mg total) by mouth 4 (four) times daily as needed for diarrhea or loose stools. 12 capsule 0   nicotine  (NICODERM CQ  - DOSED IN MG/24 HOURS) 21 mg/24hr patch Place 1 patch (21 mg total) onto the skin daily. 28 patch 2   omega-3 acid ethyl esters (LOVAZA ) 1 g capsule Take 1 capsule (1 g total) by mouth 2 (two) times daily. 60 capsule 2   simvastatin  (ZOCOR ) 10 MG tablet Take 1 tablet (10 mg total) by mouth every evening. 30 tablet 1   No current facility-administered medications for this visit.   Objective:  Psychiatric Specialty Exam: Physical Exam Constitutional:      Appearance: the patient is not toxic-appearing.  Pulmonary:     Effort: Pulmonary effort is normal.  Neurological:     General: No focal deficit present.     Mental Status: the patient is alert and oriented to person, place, and time.   Review of Systems  Respiratory:  Negative for shortness of breath.   Cardiovascular:  Negative for chest pain.  Gastrointestinal:  Negative for abdominal pain, constipation, diarrhea, nausea and vomiting.  Neurological:  Negative for headaches.    BP (!) 144/90   Pulse 77   Wt 216 lb 3.2 oz (98.1 kg)   BMI 28.52 kg/m   General  Appearance: Fairly Groomed, cigarette smoke smell  Eye Contact:  Good  Speech:  Clear and Coherent  Volume:  Normal  Mood: even  Affect:  Congruent, appropriate  Thought Process:  Coherent  Orientation:  Full (Time, Place, and Person)  Thought Content: Logical   Suicidal Thoughts:  No  Homicidal Thoughts:  No  Memory:  Immediate;   Good  Judgement:  fair  Insight:  fair  Psychomotor Activity:  Normal  Concentration:  Concentration: Good  Recall:  Good  Fund of Knowledge: Good  Language: Good  Akathisia:  No  Handed:    AIMS (if indicated): not done  Assets:  Communication Skills Desire for Improvement Financial Resources/Insurance Housing Leisure Time Physical Health  ADL's:  Intact  Cognition: WNL  Sleep:  Fair     Metabolic Disorder Labs: Lab Results  Component Value Date   HGBA1C 5.8 (A) 06/07/2023   No results found for: PROLACTIN Lab Results  Component Value Date   CHOL 250 (H) 06/07/2023   TRIG 113 06/07/2023   HDL 35 (L) 06/07/2023   CHOLHDL 7.1 (H) 06/07/2023   LDLCALC 194 (H) 06/07/2023   LDLCALC 123 (H) 06/07/2022   Lab Results  Component Value Date   TSH 0.943 12/07/2021    Therapeutic Level Labs: No results found for: LITHIUM Lab Results  Component Value Date   VALPROATE 78 04/09/2023   VALPROATE 40 (L) 11/22/2022   No results found for: CBMZ  Screenings: AIMS    Flowsheet Row Clinical Support from 10/24/2021 in [facility]  AIMS Total Score 0   GAD-7    Flowsheet Row Counselor from 02/19/2023 in [facility] Counselor from 06/26/2022 in [facility] Clinical Support from 10/24/2021 in [facility] Video Visit from 02/22/2021 in [facility] Office Visit from 11/01/2020 in [facility]  Total GAD-7 Score 1 0 7 8 11    PHQ2-9    Flowsheet Row Office Visit from  06/07/2023 in [facility] Clinical Support from 03/08/2023 in [facility] Counselor from 02/19/2023 in [facility] Counselor from 09/05/2022 in [facility] Clinical Support from 07/10/2022 in [facility]  PHQ-2 Total Score 0 1 1 1 2   PHQ-9 Total Score 0 2 1 2 5    Flowsheet Row Counselor from 02/19/2023 in [facility] from 06/02/2022 in [facility] from 01/03/2022 in [facility]  C-SSRS RISK CATEGORY No Risk No Risk No Risk    Collaboration of Care: Medication Management Dr. Carvin  A total of 30 minutes was spent involved in face to face clinical care, chart review, documentation.   Corean Minor, MD, PGY-3 09/11/2023, 9:20 AM

## 2023-09-11 ENCOUNTER — Ambulatory Visit (INDEPENDENT_AMBULATORY_CARE_PROVIDER_SITE_OTHER): Admitting: Psychiatry

## 2023-09-11 VITALS — BP 144/90 | HR 77 | Wt 216.2 lb

## 2023-09-11 DIAGNOSIS — F79 Unspecified intellectual disabilities: Secondary | ICD-10-CM | POA: Diagnosis not present

## 2023-09-11 DIAGNOSIS — R454 Irritability and anger: Secondary | ICD-10-CM | POA: Diagnosis not present

## 2023-09-11 DIAGNOSIS — F3340 Major depressive disorder, recurrent, in remission, unspecified: Secondary | ICD-10-CM | POA: Diagnosis not present

## 2023-09-11 MED ORDER — DIVALPROEX SODIUM ER 500 MG PO TB24: 500.0000 mg | ORAL_TABLET | Freq: Every day | ORAL | 2 refills | Status: DC

## 2023-09-11 MED ORDER — HYDROXYZINE HCL 25 MG PO TABS: 25.0000 mg | ORAL_TABLET | Freq: Two times a day (BID) | ORAL | 2 refills | Status: DC | PRN

## 2023-09-11 NOTE — Addendum Note (Signed)
 Addended by: CARVIN CROCK on: 09/11/2023 09:26 AM   Modules accepted: Level of Service

## 2023-09-17 ENCOUNTER — Ambulatory Visit (INDEPENDENT_AMBULATORY_CARE_PROVIDER_SITE_OTHER): Admitting: Clinical

## 2023-09-17 DIAGNOSIS — F39 Unspecified mood [affective] disorder: Secondary | ICD-10-CM

## 2023-09-17 NOTE — Progress Notes (Signed)
   THERAPIST PROGRESS NOTE  Session Time: 45 min  Participation Level: Active  Behavioral Response: CasualAlertEuthymic  Type of Therapy: Individual Therapy  Treatment Goals addressed: client will engage in at least 80% of scheduled individual psychotherapy sessions  ProgressTowards Goals: Progressing  Interventions: CBT  Summary:  Randall [name] is a 48 y.o. male who presents for the scheduled appointment oriented times five, appropriately dressed and friendly. Client denied hallucinations and delusions. Client reported on today he is doing well. Client reported he met with the new psychiatrist. Client reported she told him she does not think he has schizophrenia disorder. Client reported she did state he does indeed have a mood disorder. Client reported he was shocked she said that but he is okay with it. Client reported he is taking depakote  1 time a day. Client reported he also spoke about having it decreased because his PCP found his cholesterol was too high. Client reported he is on 2 cholesterol pills currently. Client reported his son is doing well who he previously mentioned had graduated. Client reported otherwise everything is going well. Evidence of progress towards goal:  client reported medication compliance 7 days per week.   Suicidal/Homicidal: Nowithout intent/plan  Therapist Response:  Therapist began the appointment asking the client how he has been doing. Therapist engaged with active listening and positive emotional support. Therapist used cbt to ask open ended questions about any stressors or changes that have occurred. Therapist used cbt to engage in discussion about making healthy lifestyle changes to improve health and overall mental stability. Therapist used cbt to teach the client about keeping a daily routine. Therapist used CBT ask the client to identify his progress with frequency of use with coping skills with continued practice in his daily activity.       Plan: Return again in 4 weeks.  Diagnosis: unspecified mood affective disorder  Collaboration of Care: Patient refused AEB none requested by the client.  Patient/Guardian was advised Release of Information must be obtained prior to any record release in order to collaborate their care with an outside provider. Patient/Guardian was advised if they have not already done so to contact the registration department to sign all necessary forms in order for us  to release information regarding their care.   Consent: Patient/Guardian gives verbal consent for treatment and assignment of benefits for services provided during this visit. Patient/Guardian expressed understanding and agreed to proceed.   Randall [name] Y Randall Iracheta, LCSW 09/17/2023

## 2023-09-20 ENCOUNTER — Other Ambulatory Visit: Payer: Self-pay | Admitting: Nurse Practitioner

## 2023-09-20 DIAGNOSIS — E782 Mixed hyperlipidemia: Secondary | ICD-10-CM

## 2023-10-02 ENCOUNTER — Ambulatory Visit (HOSPITAL_COMMUNITY): Admitting: Clinical

## 2023-10-08 ENCOUNTER — Ambulatory Visit (INDEPENDENT_AMBULATORY_CARE_PROVIDER_SITE_OTHER): Admitting: Clinical

## 2023-10-08 DIAGNOSIS — F39 Unspecified mood [affective] disorder: Secondary | ICD-10-CM | POA: Diagnosis not present

## 2023-10-08 NOTE — Progress Notes (Signed)
   THERAPIST PROGRESS NOTE  Session Time: 45 min  Participation Level: Active  Behavioral Response: CasualAlertEuthymic  Type of Therapy: Individual Therapy  Treatment Goals addressed: client will engage in at least 80% of scheduled individual psychotherapy sessions  ProgressTowards Goals: Progressing  Interventions: CBT  Summary:  Randall [name] is a 48 y.o. male who presents for the scheduled appointment oriented x 5, appropriately dressed, and friendly. Client denied hallucinations and delusions. Client reported he is doing well. Client reported he recently had to distance himself from an associate because he was not being treated fairly. Client reported also his son who is in [place] has been talking to him. Client reported he may move in with him so he might be looking for a bigger place. Client reported all of his children are doing well. Client reported he is going to see his pco in November to check his cholesterol levels again. Client reported no complaints.  Evidence of progress towards goal:  client reported he is medication compliant 7 days a week.  Suicidal/Homicidal: Nowithout intent/plan  Therapist Response:  Therapist began the appointment asking the client how he has been doing. Therapist engaged using active listening and positive emotional support. Therapist used cbt to engage and give him time to discuss his thoughts and feelings. Therapist used cbt to continue teaching him about positive communication,boundaries and self care. Therapist used CBT ask the client to identify his progress with frequency of use with coping skills with continued practice in his daily activity.      Plan: Return again in 4 weeks.  Diagnosis: unspecified mood affective disorder  Collaboration of Care: Patient refused AEB none requested by the client.  Patient/Guardian was advised Release of Information must be obtained prior to any record release in order to collaborate their  care with an outside provider. Patient/Guardian was advised if they have not already done so to contact the registration department to sign all necessary forms in order for us  to release information regarding their care.   Consent: Patient/Guardian gives verbal consent for treatment and assignment of benefits for services provided during this visit. Patient/Guardian expressed understanding and agreed to proceed.   Shanique Aslinger Y Takeshi Teasdale, LCSW 10/08/2023

## 2023-10-22 ENCOUNTER — Ambulatory Visit (HOSPITAL_COMMUNITY): Admitting: Clinical

## 2023-11-09 ENCOUNTER — Other Ambulatory Visit: Payer: Self-pay | Admitting: Nurse Practitioner

## 2023-11-12 ENCOUNTER — Other Ambulatory Visit: Payer: Self-pay | Admitting: Nurse Practitioner

## 2023-11-12 DIAGNOSIS — E782 Mixed hyperlipidemia: Secondary | ICD-10-CM

## 2023-11-19 ENCOUNTER — Ambulatory Visit (INDEPENDENT_AMBULATORY_CARE_PROVIDER_SITE_OTHER): Admitting: Clinical

## 2023-11-19 DIAGNOSIS — F39 Unspecified mood [affective] disorder: Secondary | ICD-10-CM | POA: Diagnosis not present

## 2023-11-19 NOTE — Progress Notes (Signed)
   THERAPIST PROGRESS NOTE  Session Time: 45 min  Participation Level: Active  Behavioral Response: CasualAlertEuthymic  Type of Therapy: Individual Therapy  Treatment Goals addressed: client will engage in at least 80% of scheduled individual psychotherapy sessions  ProgressTowards Goals: Progressing  Interventions: CBT and Supportive  Summary:  Randall [name] is a 48 y.o. male who presents for the scheduled appointment oriented times five, appropriately dressed and friendly. Client denied hallucinations and delusions. Client reported he is doing well. Client reported not much has changed. Client reported he helped his girlfriend moved to a first level apartment because of her health issues she cannot go up and down stairs anymore.  Client reported she has diabetic neuropathy.  Client reported because she deals with nerve pain a lot it affects her mood and how she talks to him.  Client reported she is very irritable and sometimes he blocks her from being able to call him to keep his peace.  Client reported otherwise regarding his son he is doing good but he does not know if he will come to stay with him because his mom has some personal thoughts about that.  Client reported he is taking his son to be more independent as he will be 20 next year to take the [employer] to come visit him in [place] if he wants.  Client reported he has another physical with his primary care physician to check his cholesterol.  Client reported overall he has been feeling well but she wants to check on his levels again. Evidence of progress towards goal: Client reported 1 positive which is continued mood stability with medication compliance 7 days a week.  Suicidal/Homicidal: Nowithout intent/plan  Therapist Response:  Therapist began the appointment asking client how he has been doing since last seen. Therapist engaged with active listening and positive emotional support. Therapist used CBT to ask client  about any changes that occurred and gave him time to discuss his thoughts and feelings about his relationship, family and his outlook on daily life. Therapist used CBT to question the client about how to set appropriate boundaries and positive behavioral activities. Therapist used CBT ask the client to identify his progress with frequency of use with coping skills with continued practice in his daily activity.    Therapist assigned him homework to practice self care.   Plan: Return again in 4 weeks.  Diagnosis: unspecified mood affective disorder  Collaboration of Care: Patient refused AEB none requested.  Patient/Guardian was advised Release of Information must be obtained prior to any record release in order to collaborate their care with an outside provider. Patient/Guardian was advised if they have not already done so to contact the registration department to sign all necessary forms in order for us  to release information regarding their care.   Consent: Patient/Guardian gives verbal consent for treatment and assignment of benefits for services provided during this visit. Patient/Guardian expressed understanding and agreed to proceed.   Rainey Kahrs Y Anush Wiedeman, LCSW 11/19/2023

## 2023-11-25 NOTE — Progress Notes (Unsigned)
 BH MD Outpatient Progress Note  11/27/2023 11:37 AM Randall [name]  MRN:  [id number]  Assessment:  Randall [name] presents for follow-up evaluation.  The patient reports that he is doing well, reports ability to control his anger in arguments with fiance and stability of mood despite psychosocial stressors regarding political environment and son's relationship with his mom. He again denies issues with his mood, sleep, or appetite. He reports compliance with medications. Blood pressure decreased from last visit and he is closely following with PCP. Also again discussed tobacco cessation and gave phone number for QUITLINE. Provided refills for hydroxyzine  and depakote .  Identifying Information: Randall [name] is a 48 y.o. y.o. male with a history of schizoaffective disorder bipolar type who is an established patient with [facility] for management of the aforementioned diagnosis.  The patient's previous diagnosis of schizoaffective disorder was removed based on prolonged stability off of any kind of dopamine blocking medication.  See below for current diagnosis.  Plan:  # MDD in remission, cannabis and alcohol abuse in remission  difficulty controlling anger, agitation Interventions: -- Continue Depakote  ER 500 mg nightly -- Continue hydroxyzine  25mg  BID for anxiety - VPA level of 78 in mid April 2025 (level while on Depakote  1000 mg qHS), CBC and CMP unremarkable - Continue therapy with Randall [name]  #Tobacco use disorder -provided cessation counseling -provided information with QUITLINE  Health maintenance:  Elevated BP - follow up with PCP   Patient was given contact information for behavioral health clinic and was instructed to call 911 for emergencies.   Follow up: Jan 27th at [facility]  Future Appointments  Date Time Provider Department Center  12/03/2023  8:00 AM Cozart, Carlyon GRADE, LCSW [facility] None  12/10/2023  9:20 AM Randall Bascom RAMAN, NP [facility]   01/08/2024  8:00 AM Cozart, Carlyon GRADE, LCSW [facility] None  02/19/2024  9:00 AM Randall Krabbe, MD [facility] None  03/13/2024  8:00 AM [facility]NUAL WELLNESS VISIT [facility]    Subjective:  Interval History:  --saw Randall [name], discussed going to PCP to check cholesterol levels   Patient reports mood is okay, reports issues with son giving his mom issues last weekend. He is irritated with SNAP program and frustration. Reports he lives with his mom. Reports having some arguments with his fiance around two times a week but he is able to control his emotions. He feels that he is able to control his emotions well. Denies depressed mood. They live separately. Patient reports getting 8 hours of sleep, reports medication is helpful.  Patient reports good appetite. Reports he takes the hydroxyzine  in the morning and afternoon, reports helps with keeping him calm, can feel somewhat sleepy with it in the afternoon. Patient reports adherence with medications. Patient reports some somnolence as a side effects. Patient reports no alcohol or cannabis substance use. Reports still tobacco use, states insurance doesn't cover it. Patient denies SI/HI/AVH, reports not hearing his mom's voice.    Visit Diagnosis:    ICD-10-CM   1. MDD (recurrent major depressive disorder) in remission  F33.40 divalproex  (DEPAKOTE  ER) 500 MG 24 hr tablet    2. Cannabis abuse, in remission  F12.11     3. Alcohol abuse, in remission  F10.11     4. Difficulty controlling anger  R45.4 divalproex  (DEPAKOTE  ER) 500 MG 24 hr tablet    hydrOXYzine  (ATARAX ) 25 MG tablet    5. Tobacco use disorder  F17.200       Past Psychiatric  History:  The patient denies any history of self-harm behavior, including suicide attempts.  He reports 1 psychiatric admission occurring in 12-11-01, after the death of his mother, reports at that time was diagnosed with MDD.  Patient has previously tried risperidone , but may have been experiencing akathisia.  Past  Medical History:  Past Medical History:  Diagnosis Date   Bipolar 1 disorder (HCC)    under control   History of kidney stones    Sinus drainage    seasonal-spring    Past Surgical History:  Procedure Laterality Date   CIRCUMCISION  age 4   MULTIPLE EXTRACTIONS WITH ALVEOLOPLASTY N/A 12/13/2018   Procedure: MULTIPLE EXTRACTION WITH ALVEOLOPLASTY;  Surgeon: Randall [name], DDS;  Location: [facility] OR;  Service: Oral Surgery;  Laterality: N/A;   NEPHROLITHOTOMY Right 12/23/2013   Procedure: RIGHT PERCUTANEOUS NEPHROLITHOTOMY WITH SURGEON ACCESS;  Surgeon: Randall LELON Salines, MD;  Location: [facility] ORS;  Service: Urology;  Laterality: Right;    Family Psychiatric History:  Dad had schizophrenia, used to boxing   Family History:  Family History  Problem Relation Age of Onset   Lung cancer Mother    Heart attack Father     Social History:  Industrial/product designer for a living  Originally from [place], moved to [place] in 2011/12/12 Has been dating for 5 years, engaged for 1 year Has a son, ex wife Had 7 siblings (6 boys, 1 girl), he's in the middle Son has autism Reports he was in special education classes in middle school Reports that 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, 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  Reports he graduated high school, went to college for a little bit, did 2 years of college  Went to a trade school in [place] On disability for mental health since 2010-12-12   Social History   Socioeconomic History   Marital status: Single    Spouse name: Not on file   Number of children: Not on file   Years of education: Not on file   Highest education level: Some college, no degree  Occupational History   Not on file  Tobacco Use   Smoking status: Every Day    Current packs/day: 1.00    Types: Cigarettes   Smokeless tobacco: Never  Vaping Use   Vaping status: Never Used  Substance and Sexual Activity   Alcohol use: Not Currently    Comment: occ   Drug use: No   Sexual activity: Not on  file  Other Topics Concern   Not on file  Social History Narrative   Not on file   Social Drivers of Health   Financial Resource Strain: Medium Risk (06/02/2023)   Overall Financial Resource Strain (CARDIA)    Difficulty of Paying Living Expenses: Somewhat hard  Food Insecurity: Food Insecurity Present (06/02/2023)   Hunger Vital Sign    Worried About Running Out of Food in the Last Year: Often true    Ran Out of Food in the Last Year: Often true  Transportation Needs: No Transportation Needs (06/02/2023)   PRAPARE - Administrator, Civil Service (Medical): No    Lack of Transportation (Non-Medical): No  Physical Activity: Insufficiently Active (06/02/2023)   Exercise Vital Sign    Days of Exercise per Week: 3 days    Minutes of Exercise per Session: 30 min  Stress: No Stress Concern Present (06/02/2023)   [employer] of Occupational Health - Occupational Stress Questionnaire    Feeling of Stress :  Only a little  Social Connections: Moderately Integrated (06/02/2023)   Social Connection and Isolation Panel    Frequency of Communication with Friends and Family: Three times a week    Frequency of Social Gatherings with Friends and Family: Twice a week    Attends Religious Services: More than 4 times per year    Active Member of [employer] or Organizations: Yes    Attends Banker Meetings: 1 to 4 times per year    Marital Status: Never married    Allergies: No Known Allergies  Current Medications: Current Outpatient Medications  Medication Sig Dispense Refill   divalproex  (DEPAKOTE  ER) 500 MG 24 hr tablet Take 1 tablet (500 mg total) by mouth at bedtime. 30 tablet 2   fluticasone  (FLONASE ) 50 MCG/ACT nasal spray Place 1-2 sprays into both nostrils daily as needed for up to 7 days for allergies. 16 g 0   hydrOXYzine  (ATARAX ) 25 MG tablet Take 1 tablet (25 mg total) by mouth 2 (two) times daily as needed. 60 tablet 2   loperamide  (IMODIUM ) 2 MG capsule Take 1  capsule (2 mg total) by mouth 4 (four) times daily as needed for diarrhea or loose stools. 12 capsule 0   omega-3 acid ethyl esters (LOVAZA ) 1 g capsule Take 1 capsule by mouth twice daily 60 capsule 0   simvastatin  (ZOCOR ) 10 MG tablet TAKE 1 TABLET BY MOUTH ONCE DAILY IN THE EVENING 30 tablet 0   No current facility-administered medications for this visit.   Objective:  Psychiatric Specialty Exam: Physical Exam Constitutional:      Appearance: the patient is not toxic-appearing.  Pulmonary:     Effort: Pulmonary effort is normal.  Neurological:     General: No focal deficit present.     Mental Status: the patient is alert and oriented to person, place, and time.   Review of Systems  Respiratory:  Negative for shortness of breath.   Cardiovascular:  Negative for chest pain.  Gastrointestinal:  Negative for abdominal pain, constipation, diarrhea, nausea and vomiting.  Neurological:  Negative for headaches.    BP 135/86   Pulse 62   Wt 224 lb 12.8 oz (102 kg)   BMI 29.66 kg/m   General Appearance: Fairly Groomed, cigarette smoke smell  Eye Contact:  Good  Speech:  Clear and Coherent  Volume:  Normal  Mood: okay, able to control  Affect:  Congruent, appropriate  Thought Process:  Coherent  Orientation:  Full (Time, Place, and Person)  Thought Content: Logical   Suicidal Thoughts:  No  Homicidal Thoughts:  No  Memory:  Immediate;   Good  Judgement:  fair  Insight:  fair  Psychomotor Activity:  Normal  Concentration:  Concentration: Good  Recall:  Good  Fund of Knowledge: Good  Language: Good  Akathisia:  No  Handed:    AIMS (if indicated): not done  Assets:  Communication Skills Desire for Improvement Financial Resources/Insurance Housing Leisure Time Physical Health  ADL's:  Intact  Cognition: WNL  Sleep:  Fair     Metabolic Disorder Labs: Lab Results  Component Value Date   HGBA1C 5.8 (A) 06/07/2023   No results found for: PROLACTIN Lab Results   Component Value Date   CHOL 250 (H) 06/07/2023   TRIG 113 06/07/2023   HDL 35 (L) 06/07/2023   CHOLHDL 7.1 (H) 06/07/2023   LDLCALC 194 (H) 06/07/2023   LDLCALC 123 (H) 06/07/2022   Lab Results  Component Value Date   TSH 0.943  12/07/2021    Therapeutic Level Labs: No results found for: LITHIUM Lab Results  Component Value Date   VALPROATE 78 04/09/2023   VALPROATE 40 (L) 11/22/2022   No results found for: CBMZ  Screenings: AIMS    Flowsheet Row Clinical Support from 10/24/2021 in [facility]  AIMS Total Score 0   GAD-7    Flowsheet Row Counselor from 02/19/2023 in [facility] Counselor from 06/26/2022 in [facility] Clinical Support from 10/24/2021 in [facility] Video Visit from 02/22/2021 in [facility] Office Visit from 11/01/2020 in [facility]  Total GAD-7 Score 1 0 7 8 11    PHQ2-9    Flowsheet Row Office Visit from 06/07/2023 in [facility] Clinical Support from 03/08/2023 in [facility] Counselor from 02/19/2023 in [facility] Counselor from 09/05/2022 in [facility] Clinical Support from 07/10/2022 in [facility]  PHQ-2 Total Score 0 1 1 1 2   PHQ-9 Total Score 0 2 1 2 5    Flowsheet Row Counselor from 02/19/2023 in [facility] UC from 06/02/2022 in [facility] from 01/03/2022 in [facility]  C-SSRS RISK CATEGORY No Risk No Risk No Risk    Collaboration of Care: Medication Management attending psychiatrist  Corean Minor, MD, PGY-3 11/27/2023, 11:37 AM

## 2023-11-27 ENCOUNTER — Ambulatory Visit (INDEPENDENT_AMBULATORY_CARE_PROVIDER_SITE_OTHER): Admitting: Psychiatry

## 2023-11-27 VITALS — BP 135/86 | HR 62 | Wt 224.8 lb

## 2023-11-27 DIAGNOSIS — F17200 Nicotine dependence, unspecified, uncomplicated: Secondary | ICD-10-CM

## 2023-11-27 DIAGNOSIS — F1211 Cannabis abuse, in remission: Secondary | ICD-10-CM

## 2023-11-27 DIAGNOSIS — F3340 Major depressive disorder, recurrent, in remission, unspecified: Secondary | ICD-10-CM | POA: Diagnosis not present

## 2023-11-27 DIAGNOSIS — R454 Irritability and anger: Secondary | ICD-10-CM

## 2023-11-27 DIAGNOSIS — F1011 Alcohol abuse, in remission: Secondary | ICD-10-CM

## 2023-11-27 MED ORDER — DIVALPROEX SODIUM ER 500 MG PO TB24: 500.0000 mg | ORAL_TABLET | Freq: Every day | ORAL | 2 refills | Status: AC

## 2023-11-27 MED ORDER — HYDROXYZINE HCL 25 MG PO TABS: 25.0000 mg | ORAL_TABLET | Freq: Two times a day (BID) | ORAL | 2 refills | Status: AC | PRN

## 2023-11-29 DIAGNOSIS — H52223 Regular astigmatism, bilateral: Secondary | ICD-10-CM | POA: Diagnosis not present

## 2023-12-03 ENCOUNTER — Ambulatory Visit (INDEPENDENT_AMBULATORY_CARE_PROVIDER_SITE_OTHER): Admitting: Clinical

## 2023-12-03 DIAGNOSIS — F3340 Major depressive disorder, recurrent, in remission, unspecified: Secondary | ICD-10-CM

## 2023-12-03 NOTE — Progress Notes (Signed)
   THERAPIST PROGRESS NOTE  Session Time: 30 min  Participation Level: Active  Behavioral Response: CasualAlertEuthymic  Type of Therapy: Individual Therapy  Treatment Goals addressed: client will engage in at least 80% of scheduled individual psychotherapy appointments  ProgressTowards Goals: Progressing  Interventions: CBT  Summary:  Randall [name] is a 48 y.o. male who presents for the scheduled appointment oriented times five, appropriately dressed and friendly. Client denied hallucinations and delusions. Client reported on today he is doing good.  Client reported not much has changed.  Client reported he was talking to his on who is on his mother side that lives in [place].  Client reported she was telling him that there has been a lot of family drama and his brother is not respecting her.  Client reported he does not know if he will go there for the holiday but notes that if he does he will go to see her and to visit his son.  Client reported he has decided to take a step back from his girlfriend because her attitude is not getting better. Client reported he is visiting his PCP next week to re check his cholesterol level. Client reported otherwise he is feeling good. Evidence of progress towards goal:  client reported he is medication compliant 7 days a week. Client reported 1 behavioral skill of use of boundaries and communicating them appropriately.    Suicidal/Homicidal: Nowithout intent/plan  Therapist Response:  Therapist began the appointment asking the client how he has been doing. Therapist engaged with active listening and positive emotional support. Therapist used cbt to engage and asked him open-ended questions about his mood, stressors, and his ability to cope with any changes going on. Therapist used cbt to engage and reinforce self care practices. Therapist used CBT ask the client to identify his progress with frequency of use with coping skills with continued  practice in his daily activity.      Plan: Return again in 4 weeks.  Diagnosis: MDD, in remission  Collaboration of Care: Patient refused AEB none requested by the client.  Patient/Guardian was advised Release of Information must be obtained prior to any record release in order to collaborate their care with an outside provider. Patient/Guardian was advised if they have not already done so to contact the registration department to sign all necessary forms in order for us  to release information regarding their care.   Consent: Patient/Guardian gives verbal consent for treatment and assignment of benefits for services provided during this visit. Patient/Guardian expressed understanding and agreed to proceed.   Ayron Fillinger Y Ernestine Rohman, LCSW 12/03/2023

## 2023-12-10 ENCOUNTER — Other Ambulatory Visit: Payer: Self-pay

## 2023-12-10 ENCOUNTER — Ambulatory Visit (INDEPENDENT_AMBULATORY_CARE_PROVIDER_SITE_OTHER): Payer: Self-pay | Admitting: Nurse Practitioner

## 2023-12-10 ENCOUNTER — Encounter: Payer: Self-pay | Admitting: Nurse Practitioner

## 2023-12-10 DIAGNOSIS — E782 Mixed hyperlipidemia: Secondary | ICD-10-CM

## 2023-12-10 MED ORDER — SIMVASTATIN 10 MG PO TABS: 10.0000 mg | ORAL_TABLET | Freq: Every evening | ORAL | 3 refills | Status: DC

## 2023-12-10 MED ORDER — OMEGA-3-ACID ETHYL ESTERS 1 G PO CAPS: 1.0000 | ORAL_CAPSULE | Freq: Two times a day (BID) | ORAL | 3 refills | Status: DC

## 2023-12-10 NOTE — Progress Notes (Signed)
 Subjective   Patient ID: Randall [name], male    DOB: [birth year], 48 y.o.   MRN: [id number]  Chief Complaint  Patient presents with   overall health   Hyperlipidemia   Ear Fullness    Right ear    Medication Refill    All medications     Referring provider: Oley Bascom RAMAN, NP  Randall [name] is a 48 y.o. male with Past Medical History: No date: Bipolar 1 disorder (HCC)     Comment:  under control No date: History of kidney stones No date: Sinus drainage     Comment:  seasonal-spring   HPI   Patient presents today for follow up.  Patient does have a history of mental illness and does see [facility] behavioral health for this.  Overall patient has been doing well.  No new issues or concerns today other than ear fullness. Upon exam, patient did need ear wash for impacted cerumen. He Denies f/c/s, n/v/d, hemoptysis, PND, leg swelling. Denies chest pain or edema.   Note: Patient left the office before getting ear wash.  It we will have him return to get this completed.   No Known Allergies  Immunization History  Administered Date(s) Administered   DTaP 01/24/2023   Influenza-Unspecified 11/18/2021, 01/23/2023   Pfizer Fall 2024 Covid-19 Vaccine 6mos thru 65yrs 11/18/2021    Tobacco History: Social History   Tobacco Use  Smoking Status Every Day   Current packs/day: 1.00   Types: Cigarettes  Smokeless Tobacco Never   Ready to quit: Not Answered Counseling given: Not Answered   Outpatient Encounter Medications as of 12/10/2023  Medication Sig   divalproex  (DEPAKOTE  ER) 500 MG 24 hr tablet Take 1 tablet (500 mg total) by mouth at bedtime.   fluticasone  (FLONASE ) 50 MCG/ACT nasal spray Place 1-2 sprays into both nostrils daily as needed for up to 7 days for allergies.   hydrOXYzine  (ATARAX ) 25 MG tablet Take 1 tablet (25 mg total) by mouth 2 (two) times daily as needed.   [DISCONTINUED] omega-3 acid ethyl esters (LOVAZA ) 1 g capsule Take 1 capsule by  mouth twice daily   [DISCONTINUED] simvastatin  (ZOCOR ) 10 MG tablet TAKE 1 TABLET BY MOUTH ONCE DAILY IN THE EVENING   loperamide  (IMODIUM ) 2 MG capsule Take 1 capsule (2 mg total) by mouth 4 (four) times daily as needed for diarrhea or loose stools.   omega-3 acid ethyl esters (LOVAZA ) 1 g capsule Take 1 capsule (1 g total) by mouth 2 (two) times daily.   simvastatin  (ZOCOR ) 10 MG tablet Take 1 tablet (10 mg total) by mouth every evening.   No facility-administered encounter medications on file as of 12/10/2023.    Review of Systems  Review of Systems  Constitutional: Negative.   HENT: Negative.    Cardiovascular: Negative.   Gastrointestinal: Negative.   Allergic/Immunologic: Negative.   Neurological: Negative.   Psychiatric/Behavioral: Negative.       Objective:   BP 129/86 (BP Location: Left Arm, Patient Position: Sitting, Cuff Size: Large)   Pulse 71   Wt 225 lb 9.6 oz (102.3 kg)   BMI 29.76 kg/m   Wt Readings from Last 5 Encounters:  12/10/23 225 lb 9.6 oz (102.3 kg)  06/07/23 213 lb (96.6 kg)  03/08/23 209 lb (94.8 kg)  06/07/22 222 lb 9.6 oz (101 kg)  03/02/22 218 lb (98.9 kg)     Physical Exam Vitals and nursing note reviewed.  Constitutional:      General: He  is not in acute distress.    Appearance: He is well-developed.  HENT:     Right Ear: There is impacted cerumen.     Left Ear: There is impacted cerumen.  Cardiovascular:     Rate and Rhythm: Normal rate and regular rhythm.  Pulmonary:     Effort: Pulmonary effort is normal.     Breath sounds: Normal breath sounds.  Skin:    General: Skin is warm and dry.  Neurological:     Mental Status: He is alert and oriented to person, place, and time.       Assessment & Plan:   Mixed hyperlipidemia -     Simvastatin ; Take 1 tablet (10 mg total) by mouth every evening.  Dispense: 90 tablet; Refill: 3  Other orders -     Omega-3-acid  Ethyl Esters; Take 1 capsule (1 g total) by mouth 2 (two) times  daily.  Dispense: 60 capsule; Refill: 3     Return in about 6 months (around 06/08/2024) for Physical.     Bascom GORMAN Borer, NP 12/10/2023

## 2023-12-11 ENCOUNTER — Ambulatory Visit (HOSPITAL_COMMUNITY): Admitting: Clinical

## 2023-12-12 ENCOUNTER — Other Ambulatory Visit: Payer: Self-pay | Admitting: Nurse Practitioner

## 2023-12-12 ENCOUNTER — Telehealth: Payer: Self-pay

## 2023-12-12 DIAGNOSIS — E782 Mixed hyperlipidemia: Secondary | ICD-10-CM

## 2023-12-12 NOTE — Telephone Encounter (Signed)
 Copied from CRM #[id number]. Topic: Clinical - Medication Prior Auth >> Dec 12, 2023  3:32 PM Alfonso HERO wrote: Reason for CRM: Patient calling because the pharmacy says a PA is needed for omega-3 acid ethyl esters (LOVAZA ) 1 g capsule

## 2023-12-13 ENCOUNTER — Telehealth: Payer: Self-pay

## 2023-12-13 DIAGNOSIS — E782 Mixed hyperlipidemia: Secondary | ICD-10-CM

## 2023-12-13 MED ORDER — ATORVASTATIN CALCIUM 40 MG PO TABS: 40.0000 mg | ORAL_TABLET | Freq: Every day | ORAL | 3 refills | Status: AC

## 2023-12-13 NOTE — Progress Notes (Signed)
 Received notification from patient advocate that patient's Lovaza  requires a PA and requires documentation of hx of triglycerides > 500 mg/dL. Upon chart review, appears patient does not have history of TG elevated above 500 mg/dL. Therefore, he does not have an ongoing indication for Lovaza . He is currently treated with a low intensity statin. Would prefer to maximize to a more effective high intensity statin, given hx of LDL-C > 190 mg/dL.   Attempted to contact patient to discuss this change. Left detailed voice mail given DPR on file. Also let patient know I would send further instructions in MyChart.  PMH includes depression, tobacco use disorder, HLD.   Lipid Panel     Component Value Date/Time   CHOL 250 (H) 06/07/2023 1047   TRIG 113 06/07/2023 1047   HDL 35 (L) 06/07/2023 1047   CHOLHDL 7.1 (H) 06/07/2023 1047   LDLCALC 194 (H) 06/07/2023 1047   LABVLDL 21 06/07/2023 1047   Clinical ASCVD: No  The 10-year ASCVD risk score (Arnett DK, et al., 2019) is: 8.9%   Values used to calculate the score:     Age: 48 years     Clincally relevant sex: Male     Is Non-Hispanic African American: Yes     Diabetic: No     Tobacco smoker: Yes     Systolic Blood Pressure: 129 mmHg     Is BP treated: No     HDL Cholesterol: 35 mg/dL     Total Cholesterol: 250 mg/dL    Hyperlipidemia/ASCVD Risk Reduction: - Currently uncontrolled with most recent LDL-C of 194 mg/dL above goal < 70 mg/dL. High intensity statin indicated. TG are elevated above goal < 150 mg/dL, but they are not > 499 mg/dL, so therefore first line treatment is maximizing statin. Ok to discontinue Lovaza , especially since not covered by insurance at this time. - Recommend to stop Lovaza , simvastatin  - Recommend to START atorvastatin  40 mg daily  Lorain Baseman, PharmD [facility] [phone]

## 2023-12-14 ENCOUNTER — Encounter: Payer: Self-pay | Admitting: Nurse Practitioner

## 2023-12-14 ENCOUNTER — Ambulatory Visit: Payer: Self-pay

## 2023-12-14 ENCOUNTER — Ambulatory Visit (INDEPENDENT_AMBULATORY_CARE_PROVIDER_SITE_OTHER): Admitting: Nurse Practitioner

## 2023-12-14 DIAGNOSIS — H6123 Impacted cerumen, bilateral: Secondary | ICD-10-CM

## 2023-12-14 NOTE — Patient Instructions (Signed)
Ear Irrigation Ear irrigation is a procedure to wash dirt and wax out of your ear canal. It's also called lavage. You may need this if you're having trouble hearing because of wax in your ear. You may also have it done as part of the treatment for an ear infection.  Getting wax and dirt out of your ear can help ear drops work better. Tell a health care provider about: Any allergies you have. All medicines you're taking, including vitamins, herbs, eye drops, creams, and over-the-counter medicines. Any problems you or family members have had with anesthesia. Any bleeding problems you have. Any surgeries you've had. This includes any ear surgeries. Any medical conditions you have, such as any problems with your ear. Whether you're pregnant or may be pregnant. What are the risks? Your health care provider will talk with you about risks. These may include: Infection. Pain. Loss of hearing. Fluid and debris being pushed into your middle ear. This can happen if there are holes in your eardrum. The procedure not working. Trauma to your ear. Feeling dizzy, light-headed, or nauseous. What happens before the procedure? You'll talk with your provider about the procedure and plan. You may be given ear drops to put in your ear 15-20 minutes before the procedure. This helps loosen the wax. What happens during the procedure?  A syringe will be filled with water or a saline solution. Saline is made of salt and water. The syringe will be gently put into your ear. The fluid will be used to wash out wax and other debris. The procedure may vary among providers and hospitals. What can I expect after the procedure? Follow the instructions given to you by your provider. Follow these instructions at home: Using ear irrigation kits In some cases, you can use an ear irrigation kit at home. Ask your provider if this is an option for you. Use the kit only as told by your provider. Read the instructions on the  package. Follow the directions for using the syringe. Use water that's at room temperature. Do not use an ear irrigation kit if: You have diabetes. This can make you more likely to get an infection. You have a hole or tear in your eardrum. You have tubes in your ears. You've had ear surgery before. You've been told not to irrigate your ears. Cleaning your ears  Clean the outside of your ear with a soft washcloth each day. If told by your provider, use a few drops of baby oil, mineral oil, glycerin, hydrogen peroxide, or earwax softening drops. Do not use cotton swabs to clean your ears. These can push wax down into the ear canal. Do not put things into your ears to try to get rid of wax. This includes ear candles. General instructions Take over-the-counter and prescription medicines only as told by your provider. If you were prescribed antibiotics, use them as told by your provider. Do not stop using the antibiotic even if you start to feel better. Keep your ear clean and dry as told by your provider. See your provider at least once a year to have your ears and hearing checked. Contact a health care provider if: Your hearing isn't getting better. Your hearing is getting worse. You have pain or redness in your ear. You feel dizzy. You have ringing in your ears. You have nausea or vomiting. This information is not intended to replace advice given to you by your health care provider. Make sure you discuss any questions you have with  your health care provider. Document Revised: 03/23/2022 Document Reviewed: 03/23/2022 Elsevier Patient Education  2024 [employer].

## 2023-12-14 NOTE — Progress Notes (Signed)
   Subjective   Patient ID: Randall [name], male    DOB: September 13, 1975, 48 y.o.   MRN: [id number]  Chief Complaint  Patient presents with   Cerumen Impaction    Referring provider: Oley Bascom RAMAN, NP  Randall [name] is a 48 y.o. male with Past Medical History: No date: Bipolar 1 disorder (HCC)     Comment:  under control No date: History of kidney stones No date: Sinus drainage     Comment:  seasonal-spring   HPI  Patient presents today for an ear wash.  At last visit both ears were noted to be impacted.  He returns today to have nurse do ear wash in office.  Patient did tolerate well. Denies f/c/s, n/v/d, hemoptysis, PND, leg swelling Denies chest pain or edema    No Known Allergies  Immunization History  Administered Date(s) Administered   DTaP 01/24/2023   Influenza-Unspecified 11/18/2021, 01/23/2023   Pfizer Fall 2024 Covid-19 Vaccine 6mos thru 19yrs 11/18/2021    Tobacco History: Social History   Tobacco Use  Smoking Status Every Day   Current packs/day: 1.00   Types: Cigarettes  Smokeless Tobacco Never   Ready to quit: Not Answered Counseling given: Not Answered   Outpatient Encounter Medications as of 12/14/2023  Medication Sig   atorvastatin  (LIPITOR) 40 MG tablet Take 1 tablet (40 mg total) by mouth daily.   divalproex  (DEPAKOTE  ER) 500 MG 24 hr tablet Take 1 tablet (500 mg total) by mouth at bedtime.   fluticasone  (FLONASE ) 50 MCG/ACT nasal spray Place 1-2 sprays into both nostrils daily as needed for up to 7 days for allergies.   hydrOXYzine  (ATARAX ) 25 MG tablet Take 1 tablet (25 mg total) by mouth 2 (two) times daily as needed.   No facility-administered encounter medications on file as of 12/14/2023.    Review of Systems  Review of Systems  Constitutional: Negative.   HENT:  Positive for ear pain.   Cardiovascular: Negative.   Gastrointestinal: Negative.   Allergic/Immunologic: Negative.   Neurological: Negative.    Psychiatric/Behavioral: Negative.       Objective:   There were no vitals taken for this visit.  Wt Readings from Last 5 Encounters:  12/10/23 225 lb 9.6 oz (102.3 kg)  06/07/23 213 lb (96.6 kg)  03/08/23 209 lb (94.8 kg)  06/07/22 222 lb 9.6 oz (101 kg)  03/02/22 218 lb (98.9 kg)     Physical Exam Vitals and nursing note reviewed.  Constitutional:      General: He is not in acute distress.    Appearance: He is well-developed.  HENT:     Right Ear: There is impacted cerumen.     Left Ear: There is impacted cerumen.  Cardiovascular:     Rate and Rhythm: Normal rate and regular rhythm.  Pulmonary:     Effort: Pulmonary effort is normal.     Breath sounds: Normal breath sounds.  Skin:    General: Skin is warm and dry.  Neurological:     Mental Status: He is alert and oriented to person, place, and time.       Assessment & Plan:   Bilateral impacted cerumen     Return if symptoms worsen or fail to improve.   Bascom RAMAN Oley, NP 12/14/2023

## 2024-01-08 ENCOUNTER — Ambulatory Visit (HOSPITAL_COMMUNITY): Admitting: Clinical

## 2024-01-29 ENCOUNTER — Ambulatory Visit (INDEPENDENT_AMBULATORY_CARE_PROVIDER_SITE_OTHER): Admitting: Clinical

## 2024-01-29 DIAGNOSIS — F3340 Major depressive disorder, recurrent, in remission, unspecified: Secondary | ICD-10-CM | POA: Diagnosis not present

## 2024-01-29 NOTE — Progress Notes (Signed)
" ° °  THERAPIST PROGRESS NOTE  Session Time: 45 minutes  Participation Level: Active  Behavioral Response: CasualAlertEuthymic  Type of Therapy: Individual Therapy  Treatment Goals addressed: client will engage in at least 80% of scheduled individual psychotherapy sessions  ProgressTowards Goals: Progressing  Interventions: CBT  Summary:  Randall [name] is a 49 y.o. male who presents for the scheduled appointment oriented times five, appropriately dressed and friendly. Client denied hallucinations and delusions. Client reported he is doing well. client reported his holiday was good. Client reported he decided to stay home and not go see his family in [place]. Client reported his children came to town to see him. Client reported his daughter flew in from [place] to see him as well. Client reported he had a good follow up with his PCP. Client reported his doctor check up went well and he was taken off of one of his cholesterol medications. Client reported he met with his new psychiatrist and was reinformed about his diagnosis. Client reported he was told he does not have schizophrenia but a mood disorder. Client reported he is fine with that. Client reported he is still in contact with his ex girlfriend and he tries to motivate her to eat better because she has diabetes.  Evidence of progress towards goal:  client reported he is medication compliant 7 days a week.   Suicidal/Homicidal: Nowithout intent/plan  Therapist Response:  Therapist began the appointment asking the client how he has been doing. Therapist engaged using active listening and positive emotional support.  Therapist used cbt to engage and ask him about any changes or concerns. Therapist used cbt to engage positively reinforce his healthy lifestyle changes to improve his health and daily activity. Therapist used CBT ask the client to identify her progress with frequency of use with coping skills with continued practice  in her daily activity.    Therapist assigned the client homework to practice self care.   Plan: Return again in 4 weeks.  Diagnosis: mDD, recurrent, in remission  Collaboration of Care: Patient refused AEB none requested by the client.  Patient/Guardian was advised Release of Information must be obtained prior to any record release in order to collaborate their care with an outside provider. Patient/Guardian was advised if they have not already done so to contact the registration department to sign all necessary forms in order for us  to release information regarding their care.   Consent: Patient/Guardian gives verbal consent for treatment and assignment of benefits for services provided during this visit. Patient/Guardian expressed understanding and agreed to proceed.   Vaniyah Lansky Y Meir Elwood, LCSW 01/29/2024  "

## 2024-01-30 ENCOUNTER — Other Ambulatory Visit: Payer: Self-pay

## 2024-01-30 DIAGNOSIS — E782 Mixed hyperlipidemia: Secondary | ICD-10-CM

## 2024-01-30 NOTE — Progress Notes (Signed)
 "  01/30/2024 Name: Randall [name] MRN: [id number] DOB: February 24, 1975  Chief Complaint  Patient presents with   Hyperlipidemia    Randall [name] is a 49 y.o. year old male who presented for a telephone visit.   They were referred to the pharmacist by their PCP for assistance in managing hyperlipidemia/cardiovascular risk reduction. SABRA PMH includes depression, tobacco use, HLD.   Subjective: Patient was last seen by PCP, Bascom Borer, NP on 12/14/23. Around that time, I received notification from patient advocate that patient's Lovaza  requires a PA and requires documentation of hx of triglycerides > 500 mg/dL. Upon chart review, appears patient does not have history of TG elevated above 500 mg/dL. Advised patient and PCP to discontinue Lovaza , stop simvastatin , and start atorvastatin  40 mg daily.   Today, patient reports doing well. Taking atorvastatin  40 mg daily. Denies missed doses. No issues with tolerability.   Care Team: Primary Care Provider: Borer Bascom RAMAN, NP ; Next Scheduled Visit: 06/09/24  Medication Access/Adherence  Current Pharmacy:  [id number] - [place] ([place]), [place] - [address] [place] ([place]) [place] [address] Phone: [phone] Fax: [phone]   Patient reports affordability concerns with their medications: No  Patient reports access/transportation concerns to their pharmacy: No  Patient reports adherence concerns with their medications:  No     Hyperlipidemia/ASCVD Risk Reduction  Current lipid lowering medications: atorvastatin  40 mg daily Medications tried in the past: simvastatin , Lovaza  (cost)  Antiplatelet regimen: n/a  ASCVD History: none known   Family History: MI Father at age 18 - reports his father had a diet high in greasy and fatty foods Family History  Problem Relation Age of Onset   Lung cancer Mother    Heart attack Father    Risk Factors: family hx, LDL-C > 190 mg/dL, smoking  Current  physical activity: stays active, 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  PREVENT Risk Score:  omesothelioma.fr - 10 year risk of CVD: 7.2% - 10 year risk of ASCVD: 5.4% - 10 year risk of HF: 1.7%   Objective:  BP Readings from Last 3 Encounters:  12/10/23 129/86  11/27/23 135/86  09/11/23 (!) 144/90    Lab Results  Component Value Date   HGBA1C 5.8 (A) 06/07/2023   HGBA1C 6.0 (H) 06/07/2022   HGBA1C 6.1 (H) 12/07/2021       Latest Ref Rng & Units 06/07/2023   10:47 AM 04/09/2023    1:30 PM 06/07/2022   10:49 AM  BMP  Glucose 70 - 99 mg/dL 87  87  95   BUN 6 - 24 mg/dL 6  9  14    Creatinine 0.76 - 1.27 mg/dL 8.82  9.01  8.69   BUN/Creat Ratio 9 - 20 5  9  11    Sodium 134 - 144 mmol/L 137  141  142   Potassium 3.5 - 5.2 mmol/L 4.7  4.8  4.0   Chloride 96 - 106 mmol/L 99  103  104   CO2 20 - 29 mmol/L 22  24  22    Calcium  8.7 - 10.2 mg/dL 9.6  9.6  9.8     Lab Results  Component Value Date   CHOL 250 (H) 06/07/2023   HDL 35 (L) 06/07/2023   LDLCALC 194 (H) 06/07/2023   TRIG 113 06/07/2023   CHOLHDL 7.1 (H) 06/07/2023    Medications Reviewed Today     Reviewed by Brinda Lorain SQUIBB, RPH-CPP (Pharmacist) on 01/30/24 at 1442  Med  List Status: <None>   Medication Order Taking? Sig Documenting Provider Last Dose Status Informant  atorvastatin  (LIPITOR) 40 MG tablet [id number] Yes Take 1 tablet (40 mg total) by mouth daily. Oley Bascom RAMAN, NP  Active   divalproex  (DEPAKOTE  ER) 500 MG 24 hr tablet [id number]  Take 1 tablet (500 mg total) by mouth at bedtime. Chien, Stephanie, MD  Active   fluticasone  (FLONASE ) 50 MCG/ACT nasal spray [id number]  Place 1-2 sprays into both nostrils daily as needed for up to 7 days for allergies. Oley Bascom RAMAN, NP  Expired 12/10/23 2359   hydrOXYzine  (ATARAX ) 25 MG tablet [id number]  Take 1 tablet (25 mg total) by mouth 2 (two) times daily as needed.  Graham Krabbe, MD  Active               Assessment/Plan:   Hyperlipidemia/ASCVD Risk Reduction: - Currently uncontrolled with most recent LDL-C of 194 mg/dL above goal < 70 mg/dL. High intensity statin indicated. Patient is tolerating atorvastatin  well. Due for repeat lipid panel since change in therapy was ~8 weeks ago. May require addition of ezetimibe (Medicaid step therapy) and/or PCSK9i (if unable to achieve goal with ezetimibe). - Reviewed long term complications of uncontrolled cholesterol - Reviewed lifestyle recommendations to lower LDL-C including regular physical activity, 5-10% weight loss, eating a diet low in saturated fat, and increasing intake of fiber to at least 25 g per day. - Reviewed lifestyle recommendations to lower TG including following a low-carb diet, restricting alcohol intake, and increasing physical activity and exercise - Recommend to START atorvastatin  40 mg daily - Will address tobacco use at follow-up   Written patient instructions provided. Patient verbalized understanding of treatment plan.   Follow Up Plan:  Pharmacist telephone 02/15/24 PCP clinic visit in May 2026   Lorain Baseman, PharmD [facility] [phone]  "

## 2024-02-12 ENCOUNTER — Other Ambulatory Visit: Payer: Self-pay

## 2024-02-12 DIAGNOSIS — E782 Mixed hyperlipidemia: Secondary | ICD-10-CM

## 2024-02-12 NOTE — Progress Notes (Unsigned)
 [facility] MD Outpatient Progress Note  02/12/2024 8:15 AM Randall [name]  MRN:  [id number]  Assessment:  Randall [name] presents for follow-up evaluation.  The patient reports that he is doing well, reports ability to control his anger in arguments with fiance and stability of mood despite psychosocial stressors regarding political environment and son's relationship with his mom. He again denies issues with his mood, sleep, or appetite. He reports compliance with medications. Blood pressure decreased from last visit and he is closely following with PCP. Also again discussed tobacco cessation and gave phone number for QUITLINE. Provided refills for hydroxyzine  and depakote .  Identifying Information: Randall [name] is a 49 y.o. y.o. male with a history of schizoaffective disorder bipolar type who is an established patient with [facility] for management of the aforementioned diagnosis.  The patient's previous diagnosis of schizoaffective disorder was removed based on prolonged stability off of any kind of dopamine blocking medication.  See below for current diagnosis.  Plan:  # MDD in remission, cannabis and alcohol abuse in remission  difficulty controlling anger, agitation Interventions: -- Continue Depakote  ER 500 mg nightly -- Continue hydroxyzine  25mg  BID for anxiety - VPA level of 78 in mid April 2025 (level while on Depakote  1000 mg qHS), CBC and CMP unremarkable - Continue therapy with Paige  #Tobacco use disorder -provided cessation counseling -provided information with QUITLINE  Health maintenance:  Elevated BP - follow up with PCP   Patient was given contact information for behavioral health clinic and was instructed to call [phone] for emergencies.   Follow up: Jan 27th at [facility]  Future Appointments  Date Time Provider Department Center  02/12/2024  8:30 AM [facility]  02/19/2024  9:00 AM Graham Krabbe, MD [facility] None  02/25/2024  8:00 AM  Cozart, Carlyon GRADE, LCSW [facility] None  03/18/2024  8:10 AM [facility]-ANNUAL WELLNESS VISIT [facility]  06/09/2024  8:20 AM Oley Bascom RAMAN, NP [facility]    Subjective:  Interval History:  --saw Paige x2 --saw PCP and pharmacist and changed to high dose statin  Patient reports mood is *** Patient reports getting **** hours of sleep  Patient reports *** appetite Patient reports stressors include *** Patient reports ***adherence with medications. Patient reports *** side effects. Patient reports *** substance use Patient ***denies SI/HI/AVH.    Patient reports mood is okay, reports issues with son giving his mom issues last weekend. He is irritated with SNAP program and frustration. Reports he lives with his mom. Reports having some arguments with his fiance around two times a week but he is able to control his emotions. He feels that he is able to control his emotions well. Denies depressed mood. They live separately. Patient reports getting 8 hours of sleep, reports medication is helpful.  Patient reports good appetite. Reports he takes the hydroxyzine  in the morning and afternoon, reports helps with keeping him calm, can feel somewhat sleepy with it in the afternoon. Patient reports adherence with medications. Patient reports some somnolence as a side effects. Patient reports no alcohol or cannabis substance use. Reports still tobacco use, states insurance doesn't cover it. Patient denies SI/HI/AVH, reports not hearing his mom's voice.    Visit Diagnosis:  No diagnosis found.   Past Psychiatric History:  The patient denies any history of self-harm behavior, including suicide attempts.  He reports 1 psychiatric admission occurring in February 22, 2001, after the death of his mother, reports at that time was diagnosed with MDD.  Patient  has previously tried risperidone , but may have been experiencing akathisia.  Past Medical History:  Past Medical History:  Diagnosis Date   Bipolar 1 disorder (HCC)     under control   History of kidney stones    Sinus drainage    seasonal-spring    Past Surgical History:  Procedure Laterality Date   CIRCUMCISION  age 20   MULTIPLE EXTRACTIONS WITH ALVEOLOPLASTY N/A 12/13/2018   Procedure: MULTIPLE EXTRACTION WITH ALVEOLOPLASTY;  Surgeon: Sheryle Hamilton, DDS;  Location: [facility] OR;  Service: Oral Surgery;  Laterality: N/A;   NEPHROLITHOTOMY Right 12/23/2013   Procedure: RIGHT PERCUTANEOUS NEPHROLITHOTOMY WITH SURGEON ACCESS;  Surgeon: Morene LELON Salines, MD;  Location: [facility] ORS;  Service: Urology;  Laterality: Right;    Family Psychiatric History:  Dad had schizophrenia, used to boxing   Family History:  Family History  Problem Relation Age of Onset   Lung cancer Mother    Heart attack Father     Social History:  Industrial/product designer for a living  Originally from [place], moved to [place] in 2013 Has been dating for 5 years, engaged for 1 year Has a son, ex wife Had 7 siblings (6 boys, 1 girl), he's in the middle Son has autism Reports he was in special education classes in middle school Reports that 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, 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  Reports he graduated high school, went to college for a little bit, did 2 years of college  Went to a trade school in [place] On disability for mental health since 2012   Social History   Socioeconomic History   Marital status: Single    Spouse name: Not on file   Number of children: Not on file   Years of education: Not on file   Highest education level: Some college, no degree  Occupational History   Not on file  Tobacco Use   Smoking status: Every Day    Current packs/day: 1.00    Types: Cigarettes   Smokeless tobacco: Never  Vaping Use   Vaping status: Never Used  Substance and Sexual Activity   Alcohol use: Not Currently    Comment: occ   Drug use: No   Sexual activity: Not on file  Other Topics Concern   Not on file  Social History Narrative   Not on file    Social Drivers of Health   Tobacco Use: High Risk (12/14/2023)   Patient History    Smoking Tobacco Use: Every Day    Smokeless Tobacco Use: Never    Passive Exposure: Not on file  Financial Resource Strain: Medium Risk (12/03/2023)   Overall Financial Resource Strain (CARDIA)    Difficulty of Paying Living Expenses: Somewhat hard  Food Insecurity: Food Insecurity Present (12/03/2023)   Epic    Worried About Programme Researcher, Broadcasting/film/video in the Last Year: Sometimes true    Ran Out of Food in the Last Year: Sometimes true  Transportation Needs: No Transportation Needs (12/03/2023)   Epic    Lack of Transportation (Medical): No    Lack of Transportation (Non-Medical): No  Physical Activity: Sufficiently Active (12/03/2023)   Exercise Vital Sign    Days of Exercise per Week: 4 days    Minutes of Exercise per Session: 130 min  Stress: No Stress Concern Present (12/03/2023)   [employer] of Occupational Health - Occupational Stress Questionnaire    Feeling of Stress: Not at all  Social Connections: Moderately Integrated (12/03/2023)  Social Advertising Account Executive    Frequency of Communication with Friends and Family: Twice a week    Frequency of Social Gatherings with Friends and Family: Twice a week    Attends Religious Services: More than 4 times per year    Active Member of Clubs or Organizations: Yes    Attends Banker Meetings: More than 4 times per year    Marital Status: Never married  Depression (PHQ2-9): Low Risk (06/07/2023)   Depression (PHQ2-9)    PHQ-2 Score: 0  Alcohol Screen: Low Risk (03/08/2023)   Alcohol Screen    Last Alcohol Screening Score (AUDIT): 0  Housing: Low Risk (12/03/2023)   Epic    Unable to Pay for Housing in the Last Year: No    Number of Times Moved in the Last Year: 0    Homeless in the Last Year: No  Utilities: Not At Risk (03/23/2023)   AHC Utilities    Threatened with loss of utilities: No  Health Literacy: Adequate  Health Literacy (03/08/2023)   B1300 Health Literacy    Frequency of need for help with medical instructions: Never    Allergies: No Known Allergies  Current Medications: Current Outpatient Medications  Medication Sig Dispense Refill   atorvastatin  (LIPITOR) 40 MG tablet Take 1 tablet (40 mg total) by mouth daily. 90 tablet 3   divalproex  (DEPAKOTE  ER) 500 MG 24 hr tablet Take 1 tablet (500 mg total) by mouth at bedtime. 30 tablet 2   fluticasone  (FLONASE ) 50 MCG/ACT nasal spray Place 1-2 sprays into both nostrils daily as needed for up to 7 days for allergies. 16 g 0   hydrOXYzine  (ATARAX ) 25 MG tablet Take 1 tablet (25 mg total) by mouth 2 (two) times daily as needed. 60 tablet 2   No current facility-administered medications for this visit.   Objective:  Psychiatric Specialty Exam: Physical Exam Constitutional:      Appearance: the patient is not toxic-appearing.  Pulmonary:     Effort: Pulmonary effort is normal.  Neurological:     General: No focal deficit present.     Mental Status: the patient is alert and oriented to person, place, and time.   Review of Systems  Respiratory:  Negative for shortness of breath.   Cardiovascular:  Negative for chest pain.  Gastrointestinal:  Negative for abdominal pain, constipation, diarrhea, nausea and vomiting.  Neurological:  Negative for headaches.    There were no vitals taken for this visit.  General Appearance: Fairly Groomed, cigarette smoke smell  Eye Contact:  Good  Speech:  Clear and Coherent  Volume:  Normal  Mood: okay, able to control  Affect:  Congruent, appropriate  Thought Process:  Coherent  Orientation:  Full (Time, Place, and Person)  Thought Content: Logical   Suicidal Thoughts:  No  Homicidal Thoughts:  No  Memory:  Immediate;   Good  Judgement:  fair  Insight:  fair  Psychomotor Activity:  Normal  Concentration:  Concentration: Good  Recall:  Good  Fund of Knowledge: Good  Language: Good  Akathisia:   No  Handed:    AIMS (if indicated): not done  Assets:  Communication Skills Desire for Improvement Financial Resources/Insurance Housing Leisure Time Physical Health  ADL's:  Intact  Cognition: WNL  Sleep:  Fair     Metabolic Disorder Labs: Lab Results  Component Value Date   HGBA1C 5.8 (A) 06/07/2023   No results found for: PROLACTIN Lab Results  Component Value Date  CHOL 250 (H) 06/07/2023   TRIG 113 06/07/2023   HDL 35 (L) 06/07/2023   CHOLHDL 7.1 (H) 06/07/2023   LDLCALC 194 (H) 06/07/2023   LDLCALC 123 (H) 06/07/2022   Lab Results  Component Value Date   TSH 0.943 12/07/2021    Therapeutic Level Labs: No results found for: LITHIUM Lab Results  Component Value Date   VALPROATE 78 04/09/2023   VALPROATE 40 (L) 11/22/2022   No results found for: CBMZ  Screenings: AIMS    Flowsheet Row Clinical Support from 10/24/2021 in [facility]  AIMS Total Score 0   GAD-7    Flowsheet Row Counselor from 02/19/2023 in [facility] Counselor from 06/26/2022 in [facility] Clinical Support from 10/24/2021 in [facility] Video Visit from 02/22/2021 in [facility] Office Visit from 11/01/2020 in [facility]  Total GAD-7 Score 1 0 7 8 11    PHQ2-9    Flowsheet Row Office Visit from 06/07/2023 in [facility] Clinical Support from 03/08/2023 in [facility] Counselor from 02/19/2023 in [facility] Counselor from 09/05/2022 in [facility] Clinical Support from 07/10/2022 in [facility]  PHQ-2 Total Score 0 1 1 1 2   PHQ-9 Total Score 0 2 1 2 5    Flowsheet Row Counselor from 02/19/2023 in [facility] UC from 06/02/2022 in [facility] from 01/03/2022 in [facility]  C-SSRS RISK CATEGORY No Risk No Risk No Risk    Collaboration of Care: Medication Management attending psychiatrist  Corean Minor, MD, PGY-3 02/12/2024, 8:15 AM

## 2024-02-13 ENCOUNTER — Ambulatory Visit: Payer: Self-pay | Admitting: Nurse Practitioner

## 2024-02-13 LAB — LIPID PANEL
Chol/HDL Ratio: 4.4 ratio (ref 0.0–5.0)
Cholesterol, Total: 123 mg/dL (ref 100–199)
HDL: 28 mg/dL — ABNORMAL LOW
LDL Chol Calc (NIH): 81 mg/dL (ref 0–99)
Triglycerides: 66 mg/dL (ref 0–149)
VLDL Cholesterol Cal: 14 mg/dL (ref 5–40)

## 2024-02-15 ENCOUNTER — Other Ambulatory Visit

## 2024-02-19 ENCOUNTER — Encounter (HOSPITAL_COMMUNITY): Admitting: Psychiatry

## 2024-02-19 DIAGNOSIS — F3340 Major depressive disorder, recurrent, in remission, unspecified: Secondary | ICD-10-CM

## 2024-02-19 DIAGNOSIS — F1011 Alcohol abuse, in remission: Secondary | ICD-10-CM

## 2024-02-19 DIAGNOSIS — F17200 Nicotine dependence, unspecified, uncomplicated: Secondary | ICD-10-CM

## 2024-02-25 ENCOUNTER — Ambulatory Visit (HOSPITAL_COMMUNITY): Admitting: Clinical

## 2024-02-27 NOTE — Progress Notes (Unsigned)
 [facility] MD Outpatient Progress Note  02/27/2024 9:27 AM [name]  MRN:  [id number]  Assessment:  Randall [name] presents for follow-up evaluation.  The patient reports that he is doing well, reports ability to control his anger in arguments with fiance and stability of mood despite psychosocial stressors regarding political environment and son's relationship with his mom. He again denies issues with his mood, sleep, or appetite. He reports compliance with medications. Blood pressure decreased from last visit and he is closely following with PCP. Also again discussed tobacco cessation and gave phone number for QUITLINE. Provided refills for hydroxyzine  and depakote .  Identifying Information: Randall [name] is a 49 y.o. y.o. male with a history of schizoaffective disorder bipolar type who is an established patient with [facility] for management of the aforementioned diagnosis.  The patient's previous diagnosis of schizoaffective disorder was removed based on prolonged stability off of any kind of dopamine blocking medication.  See below for current diagnosis.  Plan:  # MDD in remission, cannabis and alcohol abuse in remission  difficulty controlling anger, agitation Interventions: -- Continue Depakote  ER 500 mg nightly -- Continue hydroxyzine  25mg  BID for anxiety - VPA level of 78 in mid April 2025 (level while on Depakote  1000 mg qHS), CBC and CMP unremarkable - Continue therapy with Paige  #Tobacco use disorder -provided cessation counseling -provided information with QUITLINE  Health maintenance:  Elevated BP - follow up with PCP   Patient was given contact information for behavioral health clinic and was instructed to call 911 for emergencies.   Follow up: Jan 27th at [facility]  Future Appointments  Date Time Provider Department Center  03/26/2024 11:00 AM Cozart, Carlyon GRADE, LCSW [facility] None  03/04/2024  9:00 AM Graham Krabbe, MD [facility] None  03/18/2024   8:10 AM [facility]NUAL WELLNESS VISIT [facility]  06/09/2024  8:20 AM Oley Bascom RAMAN, NP [facility]    Subjective:  Interval History:  --saw Paige x2 --saw PCP and pharmacist and changed to high dose statin  Patient reports mood is *** Patient reports getting **** hours of sleep  Patient reports *** appetite Patient reports stressors include *** Patient reports ***adherence with medications. Patient reports *** side effects. Patient reports *** substance use Patient ***denies SI/HI/AVH.    Patient reports mood is okay, reports issues with son giving his mom issues last weekend. He is irritated with SNAP program and frustration. Reports he lives with his mom. Reports having some arguments with his fiance around two times a week but he is able to control his emotions. He feels that he is able to control his emotions well. Denies depressed mood. They live separately. Patient reports getting 8 hours of sleep, reports medication is helpful.  Patient reports good appetite. Reports he takes the hydroxyzine  in the morning and afternoon, reports helps with keeping him calm, can feel somewhat sleepy with it in the afternoon. Patient reports adherence with medications. Patient reports some somnolence as a side effects. Patient reports no alcohol or cannabis substance use. Reports still tobacco use, states insurance doesn't cover it. Patient denies SI/HI/AVH, reports not hearing his mom's voice.    Visit Diagnosis:  No diagnosis found.   Past Psychiatric History:  The patient denies any history of self-harm behavior, including suicide attempts.  He reports 1 psychiatric admission occurring in Mar 26, 2001, after the death of his mother, reports at that time was diagnosed with MDD.  Patient has previously tried risperidone , but may have been experiencing akathisia.  Past Medical History:  Past Medical History:  Diagnosis Date   Bipolar 1 disorder (HCC)    under control   History of kidney stones     Sinus drainage    seasonal-spring    Past Surgical History:  Procedure Laterality Date   CIRCUMCISION  age 83   MULTIPLE EXTRACTIONS WITH ALVEOLOPLASTY N/A 12/13/2018   Procedure: MULTIPLE EXTRACTION WITH ALVEOLOPLASTY;  Surgeon: Sheryle Hamilton, DDS;  Location: [facility] OR;  Service: Oral Surgery;  Laterality: N/A;   NEPHROLITHOTOMY Right 12/23/2013   Procedure: RIGHT PERCUTANEOUS NEPHROLITHOTOMY WITH SURGEON ACCESS;  Surgeon: Morene LELON Salines, MD;  Location: [facility] ORS;  Service: Urology;  Laterality: Right;    Family Psychiatric History:  Dad had schizophrenia, used to boxing   Family History:  Family History  Problem Relation Age of Onset   Lung cancer Mother    Heart attack Father     Social History:  Industrial/product designer for a living  Originally from [place], moved to [place] in 2013 Has been dating for 5 years, engaged for 1 year Has a son, ex wife Had 7 siblings (6 boys, 1 girl), he's in the middle Son has autism Reports he was in special education classes in middle school Reports that 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, 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  Reports he graduated high school, went to college for a little bit, did 2 years of college  Went to a trade school in [place] On disability for mental health since 2012   Social History   Socioeconomic History   Marital status: Single    Spouse name: Not on file   Number of children: Not on file   Years of education: Not on file   Highest education level: Some college, no degree  Occupational History   Not on file  Tobacco Use   Smoking status: Every Day    Current packs/day: 1.00    Types: Cigarettes   Smokeless tobacco: Never  Vaping Use   Vaping status: Never Used  Substance and Sexual Activity   Alcohol use: Not Currently    Comment: occ   Drug use: No   Sexual activity: Not on file  Other Topics Concern   Not on file  Social History Narrative   Not on file   Social Drivers of Health   Tobacco Use: High  Risk (12/14/2023)   Patient History    Smoking Tobacco Use: Every Day    Smokeless Tobacco Use: Never    Passive Exposure: Not on file  Financial Resource Strain: Medium Risk (12/03/2023)   Overall Financial Resource Strain (CARDIA)    Difficulty of Paying Living Expenses: Somewhat hard  Food Insecurity: Food Insecurity Present (12/03/2023)   Epic    Worried About Programme Researcher, Broadcasting/film/video in the Last Year: Sometimes true    Ran Out of Food in the Last Year: Sometimes true  Transportation Needs: No Transportation Needs (12/03/2023)   Epic    Lack of Transportation (Medical): No    Lack of Transportation (Non-Medical): No  Physical Activity: Sufficiently Active (12/03/2023)   Exercise Vital Sign    Days of Exercise per Week: 4 days    Minutes of Exercise per Session: 130 min  Stress: No Stress Concern Present (12/03/2023)   [employer] of Occupational Health - Occupational Stress Questionnaire    Feeling of Stress: Not at all  Social Connections: Moderately Integrated (12/03/2023)   Social Connection and Isolation Panel    Frequency  of Communication with Friends and Family: Twice a week    Frequency of Social Gatherings with Friends and Family: Twice a week    Attends Religious Services: More than 4 times per year    Active Member of Clubs or Organizations: Yes    Attends Banker Meetings: More than 4 times per year    Marital Status: Never married  Depression (PHQ2-9): Low Risk (06/07/2023)   Depression (PHQ2-9)    PHQ-2 Score: 0  Alcohol Screen: Low Risk (03/08/2023)   Alcohol Screen    Last Alcohol Screening Score (AUDIT): 0  Housing: Low Risk (12/03/2023)   Epic    Unable to Pay for Housing in the Last Year: No    Number of Times Moved in the Last Year: 0    Homeless in the Last Year: No  Utilities: Not At Risk (03/23/2023)   AHC Utilities    Threatened with loss of utilities: No  Health Literacy: Adequate Health Literacy (03/08/2023)   B1300 Health Literacy     Frequency of need for help with medical instructions: Never    Allergies: No Known Allergies  Current Medications: Current Outpatient Medications  Medication Sig Dispense Refill   atorvastatin  (LIPITOR) 40 MG tablet Take 1 tablet (40 mg total) by mouth daily. 90 tablet 3   divalproex  (DEPAKOTE  ER) 500 MG 24 hr tablet Take 1 tablet (500 mg total) by mouth at bedtime. 30 tablet 2   fluticasone  (FLONASE ) 50 MCG/ACT nasal spray Place 1-2 sprays into both nostrils daily as needed for up to 7 days for allergies. 16 g 0   No current facility-administered medications for this visit.   Objective:  Psychiatric Specialty Exam: Physical Exam Constitutional:      Appearance: the patient is not toxic-appearing.  Pulmonary:     Effort: Pulmonary effort is normal.  Neurological:     General: No focal deficit present.     Mental Status: the patient is alert and oriented to person, place, and time.   Review of Systems  Respiratory:  Negative for shortness of breath.   Cardiovascular:  Negative for chest pain.  Gastrointestinal:  Negative for abdominal pain, constipation, diarrhea, nausea and vomiting.  Neurological:  Negative for headaches.    There were no vitals taken for this visit.  General Appearance: Fairly Groomed, cigarette smoke smell  Eye Contact:  Good  Speech:  Clear and Coherent  Volume:  Normal  Mood: okay, able to control  Affect:  Congruent, appropriate  Thought Process:  Coherent  Orientation:  Full (Time, Place, and Person)  Thought Content: Logical   Suicidal Thoughts:  No  Homicidal Thoughts:  No  Memory:  Immediate;   Good  Judgement:  fair  Insight:  fair  Psychomotor Activity:  Normal  Concentration:  Concentration: Good  Recall:  Good  Fund of Knowledge: Good  Language: Good  Akathisia:  No  Handed:    AIMS (if indicated): not done  Assets:  Communication Skills Desire for Improvement Financial Resources/Insurance Housing Leisure Time Physical  Health  ADL's:  Intact  Cognition: WNL  Sleep:  Fair     Metabolic Disorder Labs: Lab Results  Component Value Date   HGBA1C 5.8 (A) 06/07/2023   No results found for: PROLACTIN Lab Results  Component Value Date   CHOL 123 02/12/2024   TRIG 66 02/12/2024   HDL 28 (L) 02/12/2024   CHOLHDL 4.4 02/12/2024   LDLCALC 81 02/12/2024   LDLCALC 194 (H) 06/07/2023   Lab  Results  Component Value Date   TSH 0.943 12/07/2021    Therapeutic Level Labs: No results found for: LITHIUM Lab Results  Component Value Date   VALPROATE 78 04/09/2023   VALPROATE 40 (L) 11/22/2022   No results found for: CBMZ  Screenings: AIMS    Flowsheet Row Clinical Support from 10/24/2021 in [facility]  AIMS Total Score 0   GAD-7    Flowsheet Row Counselor from 02/19/2023 in [facility] Counselor from 06/26/2022 in [facility] Clinical Support from 10/24/2021 in [facility] Video Visit from 02/22/2021 in [facility] Office Visit from 11/01/2020 in [facility]  Total GAD-7 Score 1 0 7 8 11    PHQ2-9    Flowsheet Row Office Visit from 06/07/2023 in [facility] Clinical Support from 03/08/2023 in [facility] Counselor from 02/19/2023 in [facility] Counselor from 09/05/2022 in [facility] Clinical Support from 07/10/2022 in [facility]  PHQ-2 Total Score 0 1 1 1 2   PHQ-9 Total Score 0 2 1 2 5    Flowsheet Row Counselor from 02/19/2023 in [facility] UC from 06/02/2022 in [facility] from 01/03/2022 in [facility]  C-SSRS RISK CATEGORY No Risk No  Risk No Risk    Collaboration of Care: Medication Management attending psychiatrist  Corean Minor, MD, PGY-3 02/27/2024, 9:27 AM

## 2024-02-28 NOTE — Telephone Encounter (Signed)
 Copied from CRM [id number]. Topic: General - Other >> Feb 28, 2024 11:24 AM Amber H wrote: Reason for CRM: Patient called wanting to know why he had to take a Hep C shot. He saw the order on his Mychart. Stated he took an HIV test last year and it was negative.    [phone]  Pt was called and advised of Hep C being a screening test that can be done at his next CPE  appointmtent. KH

## 2024-03-03 ENCOUNTER — Ambulatory Visit (HOSPITAL_COMMUNITY): Admitting: Clinical

## 2024-03-04 ENCOUNTER — Encounter (HOSPITAL_COMMUNITY): Admitting: Psychiatry

## 2024-03-13 ENCOUNTER — Ambulatory Visit: Payer: Self-pay

## 2024-03-18 ENCOUNTER — Ambulatory Visit

## 2024-06-09 ENCOUNTER — Ambulatory Visit: Payer: Self-pay | Admitting: Nurse Practitioner
